# Patient Record
Sex: Female | Born: 1945 | Smoking: Former smoker
Health system: Southern US, Community
[De-identification: ages and names within clinical notes are randomized; demographics above are authoritative.]

## PROBLEM LIST (undated history)

## (undated) DIAGNOSIS — T7840XA Allergy, unspecified, initial encounter: Secondary | ICD-10-CM

## (undated) DIAGNOSIS — J45909 Unspecified asthma, uncomplicated: Secondary | ICD-10-CM

## (undated) DIAGNOSIS — F32A Depression, unspecified: Secondary | ICD-10-CM

## (undated) DIAGNOSIS — E119 Type 2 diabetes mellitus without complications: Secondary | ICD-10-CM

## (undated) DIAGNOSIS — F419 Anxiety disorder, unspecified: Secondary | ICD-10-CM

## (undated) DIAGNOSIS — M1990 Unspecified osteoarthritis, unspecified site: Secondary | ICD-10-CM

## (undated) HISTORY — DX: Unspecified osteoarthritis, unspecified site: M19.90

## (undated) HISTORY — DX: Depression, unspecified: F32.A

## (undated) HISTORY — PX: ABDOMINAL HYSTERECTOMY: SHX81

## (undated) HISTORY — DX: Type 2 diabetes mellitus without complications: E11.9

## (undated) HISTORY — DX: Unspecified asthma, uncomplicated: J45.909

## (undated) HISTORY — DX: Allergy, unspecified, initial encounter: T78.40XA

## (undated) HISTORY — PX: TONSILLECTOMY: SUR1361

## (undated) HISTORY — DX: Anxiety disorder, unspecified: F41.9

---

## 2004-07-27 DIAGNOSIS — J309 Allergic rhinitis, unspecified: Secondary | ICD-10-CM | POA: Insufficient documentation

## 2005-06-16 DIAGNOSIS — K5732 Diverticulitis of large intestine without perforation or abscess without bleeding: Secondary | ICD-10-CM | POA: Insufficient documentation

## 2005-06-21 DIAGNOSIS — B029 Zoster without complications: Secondary | ICD-10-CM | POA: Insufficient documentation

## 2005-07-26 DIAGNOSIS — K8020 Calculus of gallbladder without cholecystitis without obstruction: Secondary | ICD-10-CM | POA: Insufficient documentation

## 2005-12-21 DIAGNOSIS — IMO0002 Reserved for concepts with insufficient information to code with codable children: Secondary | ICD-10-CM | POA: Insufficient documentation

## 2009-05-28 DIAGNOSIS — E559 Vitamin D deficiency, unspecified: Secondary | ICD-10-CM | POA: Insufficient documentation

## 2009-05-28 DIAGNOSIS — E538 Deficiency of other specified B group vitamins: Secondary | ICD-10-CM | POA: Insufficient documentation

## 2009-06-16 DIAGNOSIS — F32A Depression, unspecified: Secondary | ICD-10-CM | POA: Insufficient documentation

## 2010-07-21 DIAGNOSIS — F411 Generalized anxiety disorder: Secondary | ICD-10-CM | POA: Insufficient documentation

## 2011-08-03 DIAGNOSIS — Z90710 Acquired absence of both cervix and uterus: Secondary | ICD-10-CM | POA: Insufficient documentation

## 2011-08-03 DIAGNOSIS — Z9079 Acquired absence of other genital organ(s): Secondary | ICD-10-CM | POA: Insufficient documentation

## 2012-09-05 DIAGNOSIS — R7303 Prediabetes: Secondary | ICD-10-CM | POA: Insufficient documentation

## 2013-07-17 DIAGNOSIS — J4520 Mild intermittent asthma, uncomplicated: Secondary | ICD-10-CM | POA: Insufficient documentation

## 2017-10-10 DIAGNOSIS — M170 Bilateral primary osteoarthritis of knee: Secondary | ICD-10-CM | POA: Insufficient documentation

## 2019-09-06 DIAGNOSIS — K219 Gastro-esophageal reflux disease without esophagitis: Secondary | ICD-10-CM | POA: Insufficient documentation

## 2020-09-24 DIAGNOSIS — E669 Obesity, unspecified: Secondary | ICD-10-CM | POA: Insufficient documentation

## 2020-10-06 DIAGNOSIS — M25512 Pain in left shoulder: Secondary | ICD-10-CM | POA: Insufficient documentation

## 2020-10-23 LAB — COLOGUARD: COLOGUARD: NEGATIVE

## 2020-10-23 LAB — EXTERNAL GENERIC LAB PROCEDURE: COLOGUARD: NEGATIVE

## 2020-12-25 ENCOUNTER — Ambulatory Visit (HOSPITAL_COMMUNITY): Payer: Medicare Other | Attending: Specialist | Admitting: Occupational Therapy

## 2020-12-25 ENCOUNTER — Other Ambulatory Visit: Payer: Self-pay

## 2020-12-25 ENCOUNTER — Encounter (HOSPITAL_COMMUNITY): Payer: Self-pay | Admitting: Occupational Therapy

## 2020-12-25 DIAGNOSIS — M25612 Stiffness of left shoulder, not elsewhere classified: Secondary | ICD-10-CM | POA: Insufficient documentation

## 2020-12-25 DIAGNOSIS — M25512 Pain in left shoulder: Secondary | ICD-10-CM | POA: Insufficient documentation

## 2020-12-25 DIAGNOSIS — R29898 Other symptoms and signs involving the musculoskeletal system: Secondary | ICD-10-CM | POA: Diagnosis present

## 2020-12-25 NOTE — Patient Instructions (Signed)
1) SHOULDER: Flexion On Table   Place hands on towel placed on table, elbows straight. Lean forward with you upper body, pushing towel away from body.  __10_ reps per set, __3_ sets per day  2) Abduction (Passive)   With arm out to side, resting on towel placed on table with palm DOWN, keeping trunk away from table, lean to the side while pushing towel away from body.  Repeat __10__ times. Do __3__ sessions per day.  Copyright  VHI. All rights reserved.     3) Internal Rotation (Assistive)   Seated with elbow bent at right angle and held against side, slide arm on table surface in an inward arc keeping elbow anchored in place. Repeat __10__ times. Do ___3_ sessions per day. Activity: Use this motion to brush crumbs off the table.  Copyright  VHI. All rights reserved.   

## 2020-12-26 ENCOUNTER — Ambulatory Visit (HOSPITAL_COMMUNITY): Payer: Medicare Other | Admitting: Occupational Therapy

## 2020-12-26 NOTE — Therapy (Signed)
[place], [place], [address] Phone: [phone]   Fax:  [phone]  Occupational Therapy Evaluation  Patient Details  Name: Brandy Leon MRN: [id number] Date of Birth: 27-May-1945 Referring Provider (OT): Dr. Sydnee Cabal   Encounter Date: 12/25/2020   OT End of Session - 12/25/20 1419     Visit Number 1    Number of Visits 16    Date for OT Re-Evaluation 02/23/21   Mini-reassessment 01/24/2021   Authorization Type [employer] Medicare, $30 copay    Authorization Time Period visits based on medical necessity    Progress Note Due on Visit 10    OT Start Time 1258    OT Stop Time 1345    OT Time Calculation (min) 47 min    Activity Tolerance Patient tolerated treatment well    Behavior During Therapy [facility] for tasks assessed/performed             History reviewed. No pertinent past medical history.  History reviewed. No pertinent surgical history.  There were no vitals filed for this visit.   Subjective Assessment - 12/25/20 1349     Subjective  S: This is one nothing compared to the right shoulder.    Pertinent History Pt is a 75 y/o female s/p left arthroscopic RCR, SAD, DCR on 12/12/20 presenting in abduction sling. Pt will wear abduction pillow for 2 more weeks then remove, wearing sling for 2 additional weeks after that per MD note. Pt was referred to occupational therapy for evaluation and treatment by Dr. Sydnee Cabal.    Special Tests FOTO: 4/100    Patient Stated Goals To have less pain and more use of the LUE.    Currently in Pain? Yes    Pain Score 4     Pain Location Shoulder    Pain Orientation Left    Pain Descriptors / Indicators Aching;Sore    Pain Type Acute pain    Pain Radiating Towards N/A    Pain Onset 1 to 4 weeks ago    Pain Frequency Intermittent    Aggravating Factors  movement    Pain Relieving Factors rest, ice, pain medication    Effect of Pain on Daily Activities unable to  use LUE for ADLs    Multiple Pain Sites No               [facility] OT Assessment - 12/25/20 1247       Assessment   Medical Diagnosis s/p left arthroscopic RCR, SAD, DCR    Referring Provider (OT) Dr. Sydnee Cabal    Onset Date/Surgical Date 12/12/20    Hand Dominance Right    Next MD Visit 4 weeks    Prior Therapy None      Precautions   Precautions Shoulder    Type of Shoulder Precautions See protocol    Shoulder Interventions Shoulder sling/immobilizer;Off for dressing/bathing/exercises      Restrictions   Weight Bearing Restrictions Yes    LUE Weight Bearing Non weight bearing      Balance Screen   Has the patient fallen in the past 6 months No      Prior Function   Level of Independence Independent    Vocation Retired    Leisure working outside, walking dogs      ADL   ADL comments Pt is unable to use LUE for ADLs and leisure tasks. Sleep is difficult.      Written Expression   Dominant Hand  Right      Cognition   Overall Cognitive Status Within Functional Limits for tasks assessed      Observation/Other Assessments   Focus on Therapeutic Outcomes (FOTO)  4/100      ROM / Strength   AROM / PROM / Strength AROM;PROM;Strength      Palpation   Palpation comment mod fascial restrictions along upper arm, anterior shoulder, trapezius, and scapular regions      AROM   Overall AROM  Deficits;Unable to assess;Due to precautions;Due to pain      PROM   Overall PROM Comments Assessed supine, er/IR adducted    PROM Assessment Site Shoulder    Right/Left Shoulder Left    Left Shoulder Flexion 60 Degrees    Left Shoulder ABduction 50 Degrees    Left Shoulder Internal Rotation 90 Degrees    Left Shoulder External Rotation -9 Degrees      Strength   Overall Strength Deficits;Unable to assess;Due to precautions;Due to pain                              OT Education - 12/25/20 1329     Education Details table slides    Person(s) Educated  Patient    Methods Explanation;Demonstration;Handout    Comprehension Verbalized understanding;Returned demonstration              OT Short Term Goals - 12/26/20 0708       OT SHORT TERM GOAL #1   Title Pt will be provided with and educated on HEP to improve mobility of LUE required for ADL completion.    Time 4    Period Weeks    Status New    Target Date 01/25/21      OT SHORT TERM GOAL #2   Title Pt will increase LUE P/ROM to [facility] to improve ability to complete dressing tasks independently with minimal compensatory techniques.    Time 4    Period Weeks    Status New      OT SHORT TERM GOAL #3   Title Pt will increase LUE strength to 3/5 to improve ability to reach items at waist to chest height during ADLs.    Time 4    Period Weeks    Status New               OT Long Term Goals - 12/26/20 0713       OT LONG TERM GOAL #1   Title Pt will return to highest level of functioning using LUE as non-dominant during functional task completion.    Time 8    Period Weeks    Status New    Target Date 02/24/21      OT LONG TERM GOAL #2   Title Pt will decrease LUE pain to 3/10 or less to improve ability to sleep in the bed for 3+ hours without waking due to pain.    Time 8    Period Weeks    Status New      OT LONG TERM GOAL #3   Title Pt will decrease LUE fascial restrictions to min amounts or less to improve mobility required for overhead reaching tasks.    Time 8    Period Weeks    Status New      OT LONG TERM GOAL #4   Title Pt will increase LUE A/ROM to [facility] to improve ability to reach overhead and behind back during  dressing and bathing tasks.    Time 8    Period Weeks    Status New      OT LONG TERM GOAL #5   Title Pt will increase LUE strength to 4+/5 to improve ability to perform unpacking duties and care for dogs using LUE as non-dominant.    Time 8    Period Weeks    Status New                   Plan - 12/25/20 1420     Clinical  Impression Statement A: Pt is a 75 y/o female s/p left arthroscopic RCR, SAD, DCR on 12/12/20 presenting with pain and limitations impacting functional use of LUE during ADLs and leisure tasks. Pt with abduction sling, can remove pillow in 2 weeks. Pt had right RCR last year and is familiar with the general rehab protocol, requests ice immediately after exercises at therapy as this helped her pain and functioning significantly after prior surgery.    OT Occupational Profile and History Problem Focused Assessment - Including review of records relating to presenting problem    Occupational performance deficits (Please refer to evaluation for details): ADL's;IADL's;Rest and Sleep;Leisure    Body Structure / Function / Physical Skills ADL;Endurance;Muscle spasms;UE functional use;Fascial restriction;Pain;ROM;IADL;Strength;Mobility    Rehab Potential Good    Clinical Decision Making Limited treatment options, no task modification necessary    Comorbidities Affecting Occupational Performance: None    Modification or Assistance to Complete Evaluation  No modification of tasks or assist necessary to complete eval    OT Frequency 2x / week    OT Duration 8 weeks    OT Treatment/Interventions Self-care/ADL training;Ultrasound;Patient/family education;DME and/or AE instruction;Cryotherapy;Electrical Stimulation;Splinting;Moist Heat;Therapeutic exercise;Manual Therapy;Therapeutic activities    Plan P: Pt will benefit from skilled OT services to decrease pain and fascial restrictions, increase joint ROM, strength, and functional use of LUE during ADLs. Treatment plan: myofascial release and manual techniques, P/ROM, AA/ROM, A/ROM, general LUE strengthening, scapular mobility/stability/strengthening, modalities prn.    OT Home Exercise Plan eval: table slides    Consulted and Agree with Plan of Care Patient             Patient will benefit from skilled therapeutic intervention in order to improve the  following deficits and impairments:   Body Structure / Function / Physical Skills: ADL, Endurance, Muscle spasms, UE functional use, Fascial restriction, Pain, ROM, IADL, Strength, Mobility       Visit Diagnosis: Acute pain of left shoulder  Stiffness of left shoulder, not elsewhere classified  Other symptoms and signs involving the musculoskeletal system    Problem List There are no problems to display for this patient.   Guadelupe Sabin, OTR/L  [phone] 12/26/2020, 7:17 AM  [facility] [address] [place], [place], [address] Phone: [phone]   Fax:  [phone]  Name: Fransheska Willingham MRN: [id number] Date of Birth: 09-20-1945

## 2021-01-01 ENCOUNTER — Ambulatory Visit (HOSPITAL_COMMUNITY): Payer: Medicare Other

## 2021-01-01 ENCOUNTER — Other Ambulatory Visit: Payer: Self-pay

## 2021-01-01 ENCOUNTER — Encounter (HOSPITAL_COMMUNITY): Payer: Self-pay

## 2021-01-01 DIAGNOSIS — R29898 Other symptoms and signs involving the musculoskeletal system: Secondary | ICD-10-CM

## 2021-01-01 DIAGNOSIS — M25512 Pain in left shoulder: Secondary | ICD-10-CM

## 2021-01-01 DIAGNOSIS — M25612 Stiffness of left shoulder, not elsewhere classified: Secondary | ICD-10-CM

## 2021-01-01 NOTE — Therapy (Signed)
[place], [place], [address] Phone: [phone]   Fax:  [phone]  Occupational Therapy Treatment  Patient Details  Name: Brandy Leon MRN: [id number] Date of Birth: 1945-05-11 Referring Provider (OT): Dr. Sydnee Cabal   Encounter Date: 01/01/2021   OT End of Session - 01/01/21 1520     Visit Number 2    Number of Visits 16    Date for OT Re-Evaluation 02/23/21   Mini-reassessment 01/24/2021   Authorization Type [employer] Medicare, $30 copay    Authorization Time Period visits based on medical necessity    Progress Note Due on Visit 10    OT Start Time 1430    OT Stop Time 1508    OT Time Calculation (min) 38 min    Activity Tolerance Patient tolerated treatment well    Behavior During Therapy [facility] for tasks assessed/performed             History reviewed. No pertinent past medical history.  History reviewed. No pertinent surgical history.  There were no vitals filed for this visit.   Subjective Assessment - 01/01/21 1433     Currently in Pain? Yes    Pain Score 6     Pain Location Shoulder    Pain Orientation Left    Pain Descriptors / Indicators Constant;Sore    Pain Type Surgical pain    Pain Onset 1 to 4 weeks ago    Pain Frequency Constant    Aggravating Factors  some movement    Pain Relieving Factors pain medication, muscle relaxers, rest, ice    Effect of Pain on Daily Activities Unable to use LUE for ADLs.                [facility] OT Assessment - 01/01/21 1455       Assessment   Medical Diagnosis s/p left arthroscopic RCR, SAD, DCR      Precautions   Precautions Shoulder    Type of Shoulder Precautions See protocol    Shoulder Interventions Shoulder sling/immobilizer;Off for dressing/bathing/exercises                      OT Treatments/Exercises (OP) - 01/01/21 1456       Exercises   Exercises Shoulder      Shoulder Exercises: Supine   Protraction PROM;5  reps    Horizontal ABduction PROM;5 reps;Limitations    Horizontal ABduction Limitations limited range    External Rotation PROM;5 reps    Internal Rotation PROM;5 reps    Flexion PROM;5 reps    ABduction PROM;5 reps;Limitations    ABduction Limitations completed with elbow flexed to 90 degrees or a little less.      Shoulder Exercises: Seated   Extension AROM;10 reps   gentle     Shoulder Exercises: Therapy Ball   Flexion 10 reps   2" hold at end stretch.     Manual Therapy   Manual Therapy Myofascial release    Manual therapy comments Manual therapy completed prior exercises.    Myofascial Release Myofascial release and manual stretching completed to left upper arm, upper trapezius, and scapularis region as well as to anterior portion of the elbow to decrease fascial restrictions and increase joint mobility in a pain free zone.                    OT Education - 01/01/21 1503     Education Details A/ROM elbow, wrist, forearm exercises  Person(s) Educated Patient    Methods Explanation;Demonstration;Handout    Comprehension Verbalized understanding;Returned demonstration              OT Short Term Goals - 01/01/21 1436       OT SHORT TERM GOAL #1   Title Pt will be provided with and educated on HEP to improve mobility of LUE required for ADL completion.    Time 4    Period Weeks    Status On-going    Target Date 01/25/21      OT SHORT TERM GOAL #2   Title Pt will increase LUE P/ROM to [facility] to improve ability to complete dressing tasks independently with minimal compensatory techniques.    Time 4    Period Weeks    Status On-going      OT SHORT TERM GOAL #3   Title Pt will increase LUE strength to 3/5 to improve ability to reach items at waist to chest height during ADLs.    Time 4    Period Weeks    Status On-going               OT Long Term Goals - 01/01/21 1436       OT LONG TERM GOAL #1   Title Pt will return to highest level of  functioning using LUE as non-dominant during functional task completion.    Time 8    Period Weeks    Status On-going      OT LONG TERM GOAL #2   Title Pt will decrease LUE pain to 3/10 or less to improve ability to sleep in the bed for 3+ hours without waking due to pain.    Time 8    Period Weeks    Status On-going      OT LONG TERM GOAL #3   Title Pt will decrease LUE fascial restrictions to min amounts or less to improve mobility required for overhead reaching tasks.    Time 8    Period Weeks    Status On-going      OT LONG TERM GOAL #4   Title Pt will increase LUE A/ROM to [facility] to improve ability to reach overhead and behind back during dressing and bathing tasks.    Time 8    Period Weeks    Status On-going      OT LONG TERM GOAL #5   Title Pt will increase LUE strength to 4+/5 to improve ability to perform unpacking duties and care for dogs using LUE as non-dominant.    Time 8    Period Weeks    Status On-going                   Plan - 01/01/21 1520     Clinical Impression Statement A: Initiated myofascial release to left upper trapezius, upper arm, scapularis region and elbow to address moderate fascial restrictions. Patient presents with pain limitations due to muscle tightness and fascial restrictions. Modifcations to passive ROM movements were provided including abduction with elbow bent. Three towel rolls placed under elbow to help with comfort level. Provided additional exercises to HEP. VC for form and technique were provided during session. Ice pack provided for 10' at end of session.    Body Structure / Function / Physical Skills ADL;Endurance;Muscle spasms;UE functional use;Fascial restriction;Pain;ROM;IADL;Strength;Mobility    Plan P: Continue to follow protocol. Add seated gentle scapular ROW and scapular depression. Continue to work on increasing tolerance to passive ROM as pain level decreases.  OT Home Exercise Plan eval: table slides 11/10: A/ROM  elbow, forearm, wrist    Consulted and Agree with Plan of Care Patient             Patient will benefit from skilled therapeutic intervention in order to improve the following deficits and impairments:   Body Structure / Function / Physical Skills: ADL, Endurance, Muscle spasms, UE functional use, Fascial restriction, Pain, ROM, IADL, Strength, Mobility       Visit Diagnosis: Other symptoms and signs involving the musculoskeletal system  Stiffness of left shoulder, not elsewhere classified  Acute pain of left shoulder    Problem List There are no problems to display for this patient.   Ailene Ravel, OTR/L,CBIS  [phone]  01/01/2021, 3:33 PM  [place] [address] [place], [place], [address] Phone: [phone]   Fax:  [phone]  Name: Enedelia Martorelli MRN: [id number] Date of Birth: 29-Nov-1945

## 2021-01-01 NOTE — Patient Instructions (Signed)
ELBOW FLEXION EXTENSION  Start with your arm at your side. Bend at your elbow to raise your forearm/hand upwards as shown. Then return to starting position and repeat. Complete 10 times. 1 set. At least 3-5 times.       AROM: Wrist Extension   With right palm down, bend wrist up. Repeat 10____ times per set. Do ____ sets per session. Do __3__ sessions per day.  Copyright  VHI. All rights reserved.   AROM: Wrist Flexion   With right palm up, bend wrist up. Repeat ___10_ times per set. Do ____ sets per session. Do __3__ sessions per day.  Copyright  VHI. All rights reserved.   AROM: Forearm Pronation / Supination   With right arm in handshake position, slowly rotate palm down until stretch is felt. Relax. Then rotate palm up until stretch is felt. Repeat __10__ times per set. Do ____ sets per session. Do __3__ sessions per day.  Copyright  VHI. All rights reserved.   AFlexion (Passive)

## 2021-01-05 ENCOUNTER — Encounter (HOSPITAL_COMMUNITY): Payer: Medicare Other

## 2021-01-06 ENCOUNTER — Ambulatory Visit (HOSPITAL_COMMUNITY): Payer: Medicare Other | Admitting: Occupational Therapy

## 2021-01-06 ENCOUNTER — Other Ambulatory Visit: Payer: Self-pay

## 2021-01-06 ENCOUNTER — Encounter (HOSPITAL_COMMUNITY): Payer: Self-pay | Admitting: Occupational Therapy

## 2021-01-06 DIAGNOSIS — M25512 Pain in left shoulder: Secondary | ICD-10-CM | POA: Diagnosis not present

## 2021-01-06 DIAGNOSIS — M25612 Stiffness of left shoulder, not elsewhere classified: Secondary | ICD-10-CM

## 2021-01-06 DIAGNOSIS — R29898 Other symptoms and signs involving the musculoskeletal system: Secondary | ICD-10-CM

## 2021-01-06 NOTE — Therapy (Signed)
[place], [place], [address] Phone: [phone]   Fax:  [phone]  Occupational Therapy Treatment  Patient Details  Name: Brandy Leon MRN: [id number] Date of Birth: 03-Feb-1946 Referring Provider (OT): Dr. Sydnee Cabal   Encounter Date: 01/06/2021   OT End of Session - 01/06/21 1155     Visit Number 3    Number of Visits 16    Date for OT Re-Evaluation 02/23/21   Mini-reassessment 01/24/2021   Authorization Type [employer] Medicare, $30 copay    Authorization Time Period visits based on medical necessity    Progress Note Due on Visit 10    OT Start Time 1117    OT Stop Time 1157    OT Time Calculation (min) 40 min    Activity Tolerance Patient tolerated treatment well    Behavior During Therapy [facility] for tasks assessed/performed             History reviewed. No pertinent past medical history.  History reviewed. No pertinent surgical history.  There were no vitals filed for this visit.   Subjective Assessment - 01/06/21 1117     Subjective  S: It's been hurting some.    Currently in Pain? Yes    Pain Score 3     Pain Location Shoulder    Pain Orientation Left    Pain Descriptors / Indicators Aching;Sore    Pain Type Acute pain    Pain Radiating Towards N/A    Pain Onset 1 to 4 weeks ago    Pain Frequency Constant    Aggravating Factors  some movement    Pain Relieving Factors pain medication, muscle relaxers, rest, ice    Effect of Pain on Daily Activities unable to use LUE for ADLs    Multiple Pain Sites No                [facility] OT Assessment - 01/06/21 1116       Assessment   Medical Diagnosis s/p left arthroscopic RCR, SAD, DCR      Precautions   Precautions Shoulder    Type of Shoulder Precautions See protocol    Shoulder Interventions Shoulder sling/immobilizer;Off for dressing/bathing/exercises                      OT Treatments/Exercises (OP) - 01/06/21 1119        Exercises   Exercises Shoulder      Shoulder Exercises: Supine   Protraction PROM;5 reps    Horizontal ABduction PROM;5 reps;Limitations    Horizontal ABduction Limitations limited range    External Rotation PROM;5 reps    Internal Rotation PROM;5 reps    Flexion PROM;5 reps    ABduction PROM;5 reps;Limitations    ABduction Limitations completed with elbow flexed to 90 degrees or a little less.      Shoulder Exercises: Seated   Extension AROM;10 reps    Row AROM;10 reps      Shoulder Exercises: Therapy Ball   Flexion 10 reps   2" hold at end range     Manual Therapy   Manual Therapy Myofascial release    Manual therapy comments Manual therapy completed prior exercises.    Myofascial Release Myofascial release and manual stretching completed to left upper arm, upper trapezius, and scapularis region as well as to anterior portion of the elbow to decrease fascial restrictions and increase joint mobility in a pain free zone.  OT Short Term Goals - 01/01/21 1436       OT SHORT TERM GOAL #1   Title Pt will be provided with and educated on HEP to improve mobility of LUE required for ADL completion.    Time 4    Period Weeks    Status On-going    Target Date 01/25/21      OT SHORT TERM GOAL #2   Title Pt will increase LUE P/ROM to [facility] to improve ability to complete dressing tasks independently with minimal compensatory techniques.    Time 4    Period Weeks    Status On-going      OT SHORT TERM GOAL #3   Title Pt will increase LUE strength to 3/5 to improve ability to reach items at waist to chest height during ADLs.    Time 4    Period Weeks    Status On-going               OT Long Term Goals - 01/01/21 1436       OT LONG TERM GOAL #1   Title Pt will return to highest level of functioning using LUE as non-dominant during functional task completion.    Time 8    Period Weeks    Status On-going      OT LONG TERM GOAL #2    Title Pt will decrease LUE pain to 3/10 or less to improve ability to sleep in the bed for 3+ hours without waking due to pain.    Time 8    Period Weeks    Status On-going      OT LONG TERM GOAL #3   Title Pt will decrease LUE fascial restrictions to min amounts or less to improve mobility required for overhead reaching tasks.    Time 8    Period Weeks    Status On-going      OT LONG TERM GOAL #4   Title Pt will increase LUE A/ROM to [facility] to improve ability to reach overhead and behind back during dressing and bathing tasks.    Time 8    Period Weeks    Status On-going      OT LONG TERM GOAL #5   Title Pt will increase LUE strength to 4+/5 to improve ability to perform unpacking duties and care for dogs using LUE as non-dominant.    Time 8    Period Weeks    Status On-going                   Plan - 01/06/21 1143     Clinical Impression Statement A: Continued with myofascial release to address fascial restrictions, small muscle knot noted at inferior scapula today. Continued with passive stretching, pt able to achieve 90 degrees flexion, however abduction and er are limited due to pain and catching. Added seated row and scapular depression, continued with extension. Verbal cuing for form and technique.    Body Structure / Function / Physical Skills ADL;Endurance;Muscle spasms;UE functional use;Fascial restriction;Pain;ROM;IADL;Strength;Mobility    Plan P: Continue to follow protocol. Continue to work on increasing tolerance to passive ROM as pain level decreases.    OT Home Exercise Plan eval: table slides 11/10: A/ROM elbow, forearm, wrist    Consulted and Agree with Plan of Care Patient             Patient will benefit from skilled therapeutic intervention in order to improve the following deficits and impairments:   Body Structure /  Function / Physical Skills: ADL, Endurance, Muscle spasms, UE functional use, Fascial restriction, Pain, ROM, IADL, Strength,  Mobility       Visit Diagnosis: Other symptoms and signs involving the musculoskeletal system  Stiffness of left shoulder, not elsewhere classified  Acute pain of left shoulder    Problem List There are no problems to display for this patient.   Guadelupe Sabin, OTR/L  [phone] 01/06/2021, 12:55 PM  [place] [address] [place], [place], [address] Phone: [phone]   Fax:  [phone]  Name: Brandy Leon MRN: [id number] Date of Birth: 1945/11/25

## 2021-01-08 ENCOUNTER — Ambulatory Visit (HOSPITAL_COMMUNITY): Payer: Medicare Other | Admitting: Occupational Therapy

## 2021-01-08 ENCOUNTER — Other Ambulatory Visit: Payer: Self-pay

## 2021-01-08 ENCOUNTER — Encounter (HOSPITAL_COMMUNITY): Payer: Self-pay | Admitting: Occupational Therapy

## 2021-01-08 DIAGNOSIS — M25512 Pain in left shoulder: Secondary | ICD-10-CM | POA: Diagnosis not present

## 2021-01-08 DIAGNOSIS — R29898 Other symptoms and signs involving the musculoskeletal system: Secondary | ICD-10-CM

## 2021-01-08 DIAGNOSIS — M25612 Stiffness of left shoulder, not elsewhere classified: Secondary | ICD-10-CM

## 2021-01-08 NOTE — Therapy (Signed)
[place], [place], [address] Phone: [phone]   Fax:  [phone]  Occupational Therapy Treatment  Patient Details  Name: Brandy Leon MRN: [id number] Date of Birth: September 24, 1945 Referring Provider (OT): Dr. Sydnee Cabal   Encounter Date: 01/08/2021   OT End of Session - 01/08/21 1135     Visit Number 4    Number of Visits 16    Date for OT Re-Evaluation 02/23/21   Mini-reassessment 01/24/2021   Authorization Type [employer] Medicare, $30 copay    Authorization Time Period visits based on medical necessity    Progress Note Due on Visit 10    OT Start Time [phone]    OT Stop Time 1026    OT Time Calculation (min) 39 min    Activity Tolerance Patient tolerated treatment well    Behavior During Therapy [facility] for tasks assessed/performed             History reviewed. No pertinent past medical history.  History reviewed. No pertinent surgical history.  There were no vitals filed for this visit.   Subjective Assessment - 01/08/21 0949     Subjective  S: They're going. (exercises)    Currently in Pain? No/denies                [facility] OT Assessment - 01/08/21 0948       Assessment   Medical Diagnosis s/p left arthroscopic RCR, SAD, DCR      Precautions   Precautions Shoulder    Type of Shoulder Precautions See protocol    Shoulder Interventions Shoulder sling/immobilizer;Off for dressing/bathing/exercises                      OT Treatments/Exercises (OP) - 01/08/21 0949       Exercises   Exercises Shoulder      Shoulder Exercises: Supine   Protraction PROM;5 reps    Horizontal ABduction PROM;5 reps;Limitations    Horizontal ABduction Limitations limited range    External Rotation PROM;5 reps;AAROM;10 reps    Internal Rotation PROM;5 reps;AAROM;10 reps    Flexion PROM;5 reps    ABduction PROM;5 reps;Limitations    ABduction Limitations limited range      Shoulder Exercises:  Seated   Extension AROM;10 reps    Row AROM;10 reps      Shoulder Exercises: Therapy Ball   Flexion 10 reps   2" hold at end range     Shoulder Exercises: Isometric Strengthening   Flexion Supine;3X5"    Extension Supine;3X5"    External Rotation Supine;3X5"    Internal Rotation Supine;3X5"    ABduction Supine;3X5"    ADduction Supine;3X5"      Manual Therapy   Manual Therapy Myofascial release    Manual therapy comments Manual therapy completed prior exercises.    Myofascial Release Myofascial release and manual stretching completed to left upper arm, upper trapezius, and scapularis region as well as to anterior portion of the elbow to decrease fascial restrictions and increase joint mobility in a pain free zone.                      OT Short Term Goals - 01/01/21 1436       OT SHORT TERM GOAL #1   Title Pt will be provided with and educated on HEP to improve mobility of LUE required for ADL completion.    Time 4    Period Weeks    Status  On-going    Target Date 01/25/21      OT SHORT TERM GOAL #2   Title Pt will increase LUE P/ROM to [facility] to improve ability to complete dressing tasks independently with minimal compensatory techniques.    Time 4    Period Weeks    Status On-going      OT SHORT TERM GOAL #3   Title Pt will increase LUE strength to 3/5 to improve ability to reach items at waist to chest height during ADLs.    Time 4    Period Weeks    Status On-going               OT Long Term Goals - 01/01/21 1436       OT LONG TERM GOAL #1   Title Pt will return to highest level of functioning using LUE as non-dominant during functional task completion.    Time 8    Period Weeks    Status On-going      OT LONG TERM GOAL #2   Title Pt will decrease LUE pain to 3/10 or less to improve ability to sleep in the bed for 3+ hours without waking due to pain.    Time 8    Period Weeks    Status On-going      OT LONG TERM GOAL #3   Title Pt will  decrease LUE fascial restrictions to min amounts or less to improve mobility required for overhead reaching tasks.    Time 8    Period Weeks    Status On-going      OT LONG TERM GOAL #4   Title Pt will increase LUE A/ROM to [facility] to improve ability to reach overhead and behind back during dressing and bathing tasks.    Time 8    Period Weeks    Status On-going      OT LONG TERM GOAL #5   Title Pt will increase LUE strength to 4+/5 to improve ability to perform unpacking duties and care for dogs using LUE as non-dominant.    Time 8    Period Weeks    Status On-going                   Plan - 01/08/21 1019     Clinical Impression Statement A: Continued with myofascial release to address fascial restrictions and muscle knots. Added supine er/IR AA/ROM and isometrics per protocol, continued with seated A/ROM. Pt tolerating approximately 100-110 degrees flexion today, abduction continues to be limited to less than 50% due to pain and catching. Verbal cuing for form and technique.    Body Structure / Function / Physical Skills ADL;Endurance;Muscle spasms;UE functional use;Fascial restriction;Pain;ROM;IADL;Strength;Mobility    Plan P: Progress to phase II of protocol adding AA/ROM as pt is able to tolerate and complete with good form    OT Home Exercise Plan eval: table slides 11/10: A/ROM elbow, forearm, wrist    Consulted and Agree with Plan of Care Patient             Patient will benefit from skilled therapeutic intervention in order to improve the following deficits and impairments:   Body Structure / Function / Physical Skills: ADL, Endurance, Muscle spasms, UE functional use, Fascial restriction, Pain, ROM, IADL, Strength, Mobility       Visit Diagnosis: Other symptoms and signs involving the musculoskeletal system  Stiffness of left shoulder, not elsewhere classified  Acute pain of left shoulder    Problem List There  are no problems to display for this  patient.   Guadelupe Sabin, OTR/L  [phone] 01/08/2021, 11:36 AM  [place] [address] [place], [place], [address] Phone: [phone]   Fax:  [phone]  Name: Brandy Leon MRN: [id number] Date of Birth: 1945-10-11

## 2021-01-12 ENCOUNTER — Ambulatory Visit (HOSPITAL_COMMUNITY): Payer: Medicare Other | Admitting: Occupational Therapy

## 2021-01-12 ENCOUNTER — Encounter (HOSPITAL_COMMUNITY): Payer: Self-pay | Admitting: Occupational Therapy

## 2021-01-12 ENCOUNTER — Other Ambulatory Visit: Payer: Self-pay

## 2021-01-12 DIAGNOSIS — M25612 Stiffness of left shoulder, not elsewhere classified: Secondary | ICD-10-CM

## 2021-01-12 DIAGNOSIS — M25512 Pain in left shoulder: Secondary | ICD-10-CM

## 2021-01-12 DIAGNOSIS — R29898 Other symptoms and signs involving the musculoskeletal system: Secondary | ICD-10-CM

## 2021-01-12 NOTE — Therapy (Signed)
[place], [place], [address] Phone: [phone]   Fax:  [phone]  Occupational Therapy Treatment  Patient Details  Name: Brandy Leon MRN: [id number] Date of Birth: [birth year] Referring Provider (OT): Dr. Sydnee Cabal   Encounter Date: 01/12/2021   OT End of Session - 01/12/21 1116     Visit Number 5    Number of Visits 16    Date for OT Re-Evaluation 02/23/21   Mini-reassessment 01/24/2021   Authorization Type [employer] Medicare, $30 copay    Authorization Time Period visits based on medical necessity    Progress Note Due on Visit 10    OT Start Time 1031    OT Stop Time 1112    OT Time Calculation (min) 41 min    Activity Tolerance Patient tolerated treatment well    Behavior During Therapy [facility] for tasks assessed/performed             History reviewed. No pertinent past medical history.  History reviewed. No pertinent surgical history.  There were no vitals filed for this visit.   Subjective Assessment - 01/12/21 1030     Subjective  S: I've been moving my arm a little bit during the day.    Currently in Pain? No/denies                [facility] OT Assessment - 01/12/21 1030       Assessment   Medical Diagnosis s/p left arthroscopic RCR, SAD, DCR      Precautions   Precautions Shoulder    Type of Shoulder Precautions See protocol                      OT Treatments/Exercises (OP) - 01/12/21 1033       Exercises   Exercises Shoulder      Shoulder Exercises: Supine   Protraction PROM;5 reps;AAROM;10 reps    Horizontal ABduction PROM;5 reps;Limitations    Horizontal ABduction Limitations limited range    External Rotation PROM;5 reps;AAROM;10 reps    Internal Rotation PROM;5 reps;AAROM;10 reps    Flexion PROM;5 reps;AAROM;10 reps    ABduction PROM;5 reps;Limitations    ABduction Limitations limited range      Shoulder Exercises: Seated   Extension AROM;10 reps     Row AROM;10 reps    External Rotation AAROM;10 reps    Internal Rotation AAROM;10 reps      Shoulder Exercises: Pulleys   Flexion 1 minute   seated   ABduction 1 minute   seated     Shoulder Exercises: ROM/Strengthening   Thumb Tacks 1' low level    Prot/Ret//Elev/Dep 1' low level    Other ROM/Strengthening Exercises Using kickball: ABCs on tabletop      Manual Therapy   Manual Therapy Myofascial release    Manual therapy comments Manual therapy completed prior exercises.    Myofascial Release Myofascial release and manual stretching completed to left upper arm, upper trapezius, and scapularis region as well as to anterior portion of the elbow to decrease fascial restrictions and increase joint mobility in a pain free zone.                      OT Short Term Goals - 01/01/21 1436       OT SHORT TERM GOAL #1   Title Pt will be provided with and educated on HEP to improve mobility of LUE required for ADL completion.  Time 4    Period Weeks    Status On-going    Target Date 01/25/21      OT SHORT TERM GOAL #2   Title Pt will increase LUE P/ROM to [facility] to improve ability to complete dressing tasks independently with minimal compensatory techniques.    Time 4    Period Weeks    Status On-going      OT SHORT TERM GOAL #3   Title Pt will increase LUE strength to 3/5 to improve ability to reach items at waist to chest height during ADLs.    Time 4    Period Weeks    Status On-going               OT Long Term Goals - 01/01/21 1436       OT LONG TERM GOAL #1   Title Pt will return to highest level of functioning using LUE as non-dominant during functional task completion.    Time 8    Period Weeks    Status On-going      OT LONG TERM GOAL #2   Title Pt will decrease LUE pain to 3/10 or less to improve ability to sleep in the bed for 3+ hours without waking due to pain.    Time 8    Period Weeks    Status On-going      OT LONG TERM GOAL #3   Title Pt  will decrease LUE fascial restrictions to min amounts or less to improve mobility required for overhead reaching tasks.    Time 8    Period Weeks    Status On-going      OT LONG TERM GOAL #4   Title Pt will increase LUE A/ROM to [facility] to improve ability to reach overhead and behind back during dressing and bathing tasks.    Time 8    Period Weeks    Status On-going      OT LONG TERM GOAL #5   Title Pt will increase LUE strength to 4+/5 to improve ability to perform unpacking duties and care for dogs using LUE as non-dominant.    Time 8    Period Weeks    Status On-going                   Plan - 01/12/21 1129     Clinical Impression Statement A: Continued with myofascial release to address fascial restrictions, passive stretching completed. Progressed to Phase II of protocol, adding AA/ROM protraction and flexion in supine, pulleys, ball ABCs, thumb tacks, and prot/ret/elev/dep. Pt continues to be limited in abduction due to pain at approximately 70 degrees. Verbal cuing for form and technique.    Body Structure / Function / Physical Skills ADL;Endurance;Muscle spasms;UE functional use;Fascial restriction;Pain;ROM;IADL;Strength;Mobility    Plan P: Continue with AA/ROM in supine, adding horizontal abduction. Continue working towards improved ROM    OT Home Exercise Plan eval: table slides 11/10: A/ROM elbow, forearm, wrist    Consulted and Agree with Plan of Care Patient             Patient will benefit from skilled therapeutic intervention in order to improve the following deficits and impairments:   Body Structure / Function / Physical Skills: ADL, Endurance, Muscle spasms, UE functional use, Fascial restriction, Pain, ROM, IADL, Strength, Mobility       Visit Diagnosis: Other symptoms and signs involving the musculoskeletal system  Stiffness of left shoulder, not elsewhere classified  Acute pain of left shoulder  Problem List There are no problems to display  for this patient.   Guadelupe Sabin, OTR/L  [phone] 01/12/2021, 11:32 AM  [place] [address] [place], [place], [address] Phone: [phone]   Fax:  [phone]  Name: Brandy Leon MRN: [id number] Date of Birth: April 17, 1945

## 2021-01-14 ENCOUNTER — Encounter (HOSPITAL_COMMUNITY): Payer: Medicare Other | Admitting: Occupational Therapy

## 2021-01-20 ENCOUNTER — Ambulatory Visit (HOSPITAL_COMMUNITY): Payer: Medicare Other

## 2021-01-20 ENCOUNTER — Other Ambulatory Visit: Payer: Self-pay

## 2021-01-20 ENCOUNTER — Encounter (HOSPITAL_COMMUNITY): Payer: Self-pay

## 2021-01-20 DIAGNOSIS — R29898 Other symptoms and signs involving the musculoskeletal system: Secondary | ICD-10-CM

## 2021-01-20 DIAGNOSIS — M25512 Pain in left shoulder: Secondary | ICD-10-CM

## 2021-01-20 DIAGNOSIS — M25612 Stiffness of left shoulder, not elsewhere classified: Secondary | ICD-10-CM

## 2021-01-20 NOTE — Patient Instructions (Signed)

## 2021-01-20 NOTE — Therapy (Signed)
[place], [place], [address] Phone: [phone]   Fax:  [phone]  Occupational Therapy Treatment  Patient Details  Name: Brandy Leon MRN: [id number] Date of Birth: [birth year] Referring Provider (OT): Dr. Sydnee Cabal  Progress Note Reporting Period 12/25/20 to 01/20/21  See note below for Objective Data and Assessment of Progress/Goals.      Encounter Date: 01/20/2021   OT End of Session - 01/20/21 1447     Visit Number 6    Number of Visits 16    Date for OT Re-Evaluation 02/23/21    Authorization Type [employer] Medicare, $30 copay    Authorization Time Period visits based on medical necessity    Progress Note Due on Visit 16    OT Start Time 1345    OT Stop Time 1430    OT Time Calculation (min) 45 min    Activity Tolerance Patient tolerated treatment well    Behavior During Therapy [facility] for tasks assessed/performed             History reviewed. No pertinent past medical history.  History reviewed. No pertinent surgical history.  There were no vitals filed for this visit.   Subjective Assessment - 01/20/21 1350     Subjective  S: I started going without the sling.    Currently in Pain? Yes    Pain Score 4     Pain Location Arm   shoulder, elbow, and wrist   Pain Orientation Left    Pain Descriptors / Indicators Aching;Sore    Pain Type Acute pain    Pain Onset 1 to 4 weeks ago    Pain Frequency Intermittent    Aggravating Factors  certain movements    Pain Relieving Factors rest, pain medicaiton, muscle relaxers, ice    Effect of Pain on Daily Activities max effect                [facility] OT Assessment - 01/20/21 1358       Assessment   Medical Diagnosis s/p left arthroscopic RCR, SAD, DCR      Precautions   Precautions Shoulder    Type of Shoulder Precautions See protocol      Observation/Other Assessments   Focus on Therapeutic Outcomes (FOTO)  50/100      ROM /  Strength   AROM / PROM / Strength AROM;PROM;Strength      Palpation   Palpation comment mod fascial restrictions along upper arm, anterior shoulder, trapezius, and scapular regions      AROM   Overall AROM Comments Assessed supine. IR/ er adducted. A/ROM not assessed prior to this date.    AROM Assessment Site Shoulder    Right/Left Shoulder Left    Left Shoulder Flexion 132 Degrees    Left Shoulder ABduction 90 Degrees    Left Shoulder Internal Rotation 90 Degrees    Left Shoulder External Rotation 20 Degrees      PROM   Overall PROM Comments Assessed supine, er/IR adducted    PROM Assessment Site Shoulder    Right/Left Shoulder Left    Left Shoulder Flexion 120 Degrees   previous: 60   Left Shoulder ABduction 110 Degrees   previous: 50   Left Shoulder Internal Rotation 90 Degrees   previous: same   Left Shoulder External Rotation 15 Degrees   previous: -9                     OT Treatments/Exercises (  OP) - 01/20/21 1407       Exercises   Exercises Shoulder      Shoulder Exercises: Supine   Protraction PROM;5 reps;AAROM;10 reps    Horizontal ABduction PROM;5 reps;AAROM;10 reps    External Rotation PROM;5 reps;AAROM;10 reps    Internal Rotation PROM;5 reps;AAROM;10 reps    Flexion PROM;5 reps;AAROM;10 reps    ABduction PROM;5 reps;Limitations    ABduction Limitations P/ROM to shoulder level      Shoulder Exercises: Seated   Protraction AAROM;10 reps    External Rotation AAROM;10 reps    Internal Rotation AAROM;10 reps    Flexion AAROM;10 reps    Abduction AAROM;10 reps      Manual Therapy   Manual Therapy Myofascial release    Manual therapy comments Manual therapy completed prior exercises.    Myofascial Release Myofascial release and manual stretching completed to left upper arm, upper trapezius, and scapularis region as well as to anterior portion of the elbow to decrease fascial restrictions and increase joint mobility in a pain free zone.                     OT Education - 01/20/21 1416     Education Details AA/ROM shoulder exercises    Person(s) Educated Patient    Methods Explanation;Demonstration;Handout    Comprehension Verbalized understanding;Returned demonstration              OT Short Term Goals - 01/20/21 1448       OT SHORT TERM GOAL #1   Title Pt will be provided with and educated on HEP to improve mobility of LUE required for ADL completion.    Time 4    Period Weeks    Status Achieved    Target Date 01/25/21      OT SHORT TERM GOAL #2   Title Pt will increase LUE P/ROM to [facility] to improve ability to complete dressing tasks independently with minimal compensatory techniques.    Time 4    Period Weeks    Status Achieved      OT SHORT TERM GOAL #3   Title Pt will increase LUE strength to 3/5 to improve ability to reach items at waist to chest height during ADLs.    Time 4    Period Weeks    Status Achieved               OT Long Term Goals - 01/01/21 1436       OT LONG TERM GOAL #1   Title Pt will return to highest level of functioning using LUE as non-dominant during functional task completion.    Time 8    Period Weeks    Status On-going      OT LONG TERM GOAL #2   Title Pt will decrease LUE pain to 3/10 or less to improve ability to sleep in the bed for 3+ hours without waking due to pain.    Time 8    Period Weeks    Status On-going      OT LONG TERM GOAL #3   Title Pt will decrease LUE fascial restrictions to min amounts or less to improve mobility required for overhead reaching tasks.    Time 8    Period Weeks    Status On-going      OT LONG TERM GOAL #4   Title Pt will increase LUE A/ROM to [facility] to improve ability to reach overhead and behind back during dressing and bathing tasks.  Time 8    Period Weeks    Status On-going      OT LONG TERM GOAL #5   Title Pt will increase LUE strength to 4+/5 to improve ability to perform unpacking duties and care for dogs  using LUE as non-dominant.    Time 8    Period Weeks    Status On-going                   Plan - 01/20/21 1448     Clinical Impression Statement A: Mini reassessment completed this date. Pt report that her follow up visit with Dr. Theda Sers tomorrow. Patient has made improvement with P/ROM even though she continues to muscle guard during passive ROM. A/ROM supine measurements were taken as well. At this time, patient has met her short term goals and is progressing towards her longer term goals. She reports donning and doffing her shirts is becoming easier. Reports continued difficulty with washing her hair due to ROM limitations. Completed supine and seated AA/ROM with HEP updated. VC for form and technique provided with modifications provided if needed due to pain.    Body Structure / Function / Physical Skills ADL;Endurance;Muscle spasms;UE functional use;Fascial restriction;Pain;ROM;IADL;Strength;Mobility    Plan P: Add pulleys and PVC pipe slide. Follow up on MD appointment    OT Home Exercise Plan eval: table slides 11/10: A/ROM elbow, forearm, wrist 11/29: AA/ROM    Consulted and Agree with Plan of Care Patient             Patient will benefit from skilled therapeutic intervention in order to improve the following deficits and impairments:   Body Structure / Function / Physical Skills: ADL, Endurance, Muscle spasms, UE functional use, Fascial restriction, Pain, ROM, IADL, Strength, Mobility       Visit Diagnosis: Stiffness of left shoulder, not elsewhere classified  Acute pain of left shoulder  Other symptoms and signs involving the musculoskeletal system    Problem List There are no problems to display for this patient.   Ailene Ravel, OTR/L,CBIS  [phone]  01/20/2021, 3:13 PM  [place] [address] [place], [place], [address] Phone: [phone]   Fax:  [phone]  Name: Brandy Leon MRN:  [id number] Date of Birth: Sep 03, 1945

## 2021-01-22 ENCOUNTER — Ambulatory Visit (HOSPITAL_COMMUNITY): Payer: Medicare Other | Attending: Specialist | Admitting: Occupational Therapy

## 2021-01-22 ENCOUNTER — Other Ambulatory Visit: Payer: Self-pay

## 2021-01-22 ENCOUNTER — Encounter (HOSPITAL_COMMUNITY): Payer: Self-pay | Admitting: Occupational Therapy

## 2021-01-22 DIAGNOSIS — M25512 Pain in left shoulder: Secondary | ICD-10-CM | POA: Diagnosis present

## 2021-01-22 DIAGNOSIS — R29898 Other symptoms and signs involving the musculoskeletal system: Secondary | ICD-10-CM | POA: Insufficient documentation

## 2021-01-22 DIAGNOSIS — M25612 Stiffness of left shoulder, not elsewhere classified: Secondary | ICD-10-CM | POA: Insufficient documentation

## 2021-01-22 NOTE — Therapy (Signed)
[place], [place], [address] Phone: [phone]   Fax:  [phone]  Occupational Therapy Treatment  Patient Details  Name: Brandy Leon MRN: [id number] Date of Birth: August 12, 1945 Referring Provider (OT): Dr. Sydnee Cabal   Encounter Date: 01/22/2021   OT End of Session - 01/22/21 1427     Visit Number 7    Number of Visits 16    Date for OT Re-Evaluation 02/23/21    Authorization Type [employer] Medicare, $30 copay    Authorization Time Period visits based on medical necessity    Progress Note Due on Visit 16    OT Start Time 1345    OT Stop Time 1426    OT Time Calculation (min) 41 min    Activity Tolerance Patient tolerated treatment well    Behavior During Therapy [facility] for tasks assessed/performed             History reviewed. No pertinent past medical history.  History reviewed. No pertinent surgical history.  There were no vitals filed for this visit.   Subjective Assessment - 01/22/21 1342     Subjective  S: They said it looks good.    Currently in Pain? Yes    Pain Score 1     Pain Location Shoulder    Pain Orientation Left    Pain Descriptors / Indicators Aching;Sore    Pain Type Acute pain    Pain Radiating Towards N/A    Pain Onset 1 to 4 weeks ago    Pain Frequency Intermittent    Aggravating Factors  certain movements    Pain Relieving Factors rest, pain medication, muscle relaxers, ice    Effect of Pain on Daily Activities mod effect    Multiple Pain Sites No                [facility] OT Assessment - 01/22/21 1342       Assessment   Medical Diagnosis s/p left arthroscopic RCR, SAD, DCR      Precautions   Precautions Shoulder    Type of Shoulder Precautions See protocol                      OT Treatments/Exercises (OP) - 01/22/21 1347       Exercises   Exercises Shoulder      Shoulder Exercises: Supine   Protraction PROM;5 reps;AAROM;10 reps     Horizontal ABduction PROM;5 reps;AAROM;10 reps    External Rotation PROM;5 reps;AAROM;10 reps    Internal Rotation PROM;5 reps;AAROM;10 reps    Flexion PROM;5 reps;AAROM;10 reps    ABduction PROM;5 reps;Limitations    ABduction Limitations P/ROM to shoulder level      Shoulder Exercises: Seated   Protraction AAROM;10 reps    External Rotation AAROM;10 reps    Internal Rotation AAROM;10 reps    Flexion AAROM;10 reps    Abduction AAROM;10 reps      Shoulder Exercises: Pulleys   Flexion 1 minute    ABduction 1 minute      Shoulder Exercises: ROM/Strengthening   Thumb Tacks 1' low level    Proximal Shoulder Strengthening, Supine 10X each, no rest breaks    Other ROM/Strengthening Exercises PVC pipe slide:                      OT Short Term Goals - 01/20/21 1448       OT SHORT TERM GOAL #1   Title Pt  will be provided with and educated on HEP to improve mobility of LUE required for ADL completion.    Time 4    Period Weeks    Status Achieved    Target Date 01/25/21      OT SHORT TERM GOAL #2   Title Pt will increase LUE P/ROM to [facility] to improve ability to complete dressing tasks independently with minimal compensatory techniques.    Time 4    Period Weeks    Status Achieved      OT SHORT TERM GOAL #3   Title Pt will increase LUE strength to 3/5 to improve ability to reach items at waist to chest height during ADLs.    Time 4    Period Weeks    Status Achieved               OT Long Term Goals - 01/01/21 1436       OT LONG TERM GOAL #1   Title Pt will return to highest level of functioning using LUE as non-dominant during functional task completion.    Time 8    Period Weeks    Status On-going      OT LONG TERM GOAL #2   Title Pt will decrease LUE pain to 3/10 or less to improve ability to sleep in the bed for 3+ hours without waking due to pain.    Time 8    Period Weeks    Status On-going      OT LONG TERM GOAL #3   Title Pt will decrease LUE  fascial restrictions to min amounts or less to improve mobility required for overhead reaching tasks.    Time 8    Period Weeks    Status On-going      OT LONG TERM GOAL #4   Title Pt will increase LUE A/ROM to [facility] to improve ability to reach overhead and behind back during dressing and bathing tasks.    Time 8    Period Weeks    Status On-going      OT LONG TERM GOAL #5   Title Pt will increase LUE strength to 4+/5 to improve ability to perform unpacking duties and care for dogs using LUE as non-dominant.    Time 8    Period Weeks    Status On-going                   Plan - 01/22/21 1411     Clinical Impression Statement A: Continued with myofascial release to address fascial restrictions, passive stretching. Pt completing AA/ROM in supine and sitting today, added proximal shoulder strengthening, PVC pipe slide, and pulleys today. Verbal cuing for form and technique. Pt with occasional flinching during AA/ROM, reports she can feel a pull in the shoulder.    Body Structure / Function / Physical Skills ADL;Endurance;Muscle spasms;UE functional use;Fascial restriction;Pain;ROM;IADL;Strength;Mobility    Plan P: Pt is now able to progressed as tolerated to phase III A/ROM. continue with AA/ROM working to increase ROM.    OT Home Exercise Plan eval: table slides 11/10: A/ROM elbow, forearm, wrist 11/29: AA/ROM    Consulted and Agree with Plan of Care Patient             Patient will benefit from skilled therapeutic intervention in order to improve the following deficits and impairments:   Body Structure / Function / Physical Skills: ADL, Endurance, Muscle spasms, UE functional use, Fascial restriction, Pain, ROM, IADL, Strength, Mobility  Visit Diagnosis: Stiffness of left shoulder, not elsewhere classified  Acute pain of left shoulder  Other symptoms and signs involving the musculoskeletal system    Problem List There are no problems to display for this  patient.   Guadelupe Sabin, OTR/L  [phone] 01/22/2021, 2:27 PM  [facility] [address] [place], [place], [address] Phone: [phone]   Fax:  [phone]  Name: Shenea Giacobbe MRN: [id number] Date of Birth: [birth year]

## 2021-01-27 ENCOUNTER — Encounter (HOSPITAL_COMMUNITY): Payer: Self-pay

## 2021-01-27 ENCOUNTER — Other Ambulatory Visit: Payer: Self-pay

## 2021-01-27 ENCOUNTER — Ambulatory Visit (HOSPITAL_COMMUNITY): Payer: Medicare Other

## 2021-01-27 DIAGNOSIS — M25612 Stiffness of left shoulder, not elsewhere classified: Secondary | ICD-10-CM

## 2021-01-27 DIAGNOSIS — R29898 Other symptoms and signs involving the musculoskeletal system: Secondary | ICD-10-CM

## 2021-01-27 DIAGNOSIS — M25512 Pain in left shoulder: Secondary | ICD-10-CM

## 2021-01-27 NOTE — Patient Instructions (Signed)
Repeat all exercises 10-15 times, 1-2 times per day.  1) Shoulder Protraction    Begin with elbows by your side, slowly "punch" straight out in front of you.      2) Shoulder Flexion    Standing:         Begin with arms at your side with thumbs pointed up, slowly raise both arms up and forward towards overhead. Try to achieve shoulder level.         3) Horizontal abduction/adduction    Standing:           Begin with arms straight out in front of you, bring out to the side in at "T" shape. Keep arms straight entire time.      4) Internal & External Rotation      Standing:     Stand with elbows at the side and elbows bent 90 degrees. Move your forearms away from your body, then bring back inward toward the body.     5) Shoulder Abduction  Standing:       Lying on your back begin with your arms flat on the table next to your side. Slowly move your arms out to the side so that they go overhead, in a jumping jack or snow angel movement. Try to achieve shoulder height. Thumb will be leading you.

## 2021-01-27 NOTE — Therapy (Signed)
[place], [place], [address] Phone: [phone]   Fax:  [phone]  Occupational Therapy Treatment  Patient Details  Name: Brandy Leon MRN: [id number] Date of Birth: 01-Aug-1945 Referring Provider (OT): Dr. Sydnee Cabal   Encounter Date: 01/27/2021   OT End of Session - 01/27/21 1504     Visit Number 8    Number of Visits 16    Date for OT Re-Evaluation 02/23/21    Authorization Type [employer] Medicare, $30 copay    Authorization Time Period visits based on medical necessity    Progress Note Due on Visit 16    OT Start Time 1351    OT Stop Time 1427    OT Time Calculation (min) 36 min    Activity Tolerance Patient tolerated treatment well    Behavior During Therapy [facility] for tasks assessed/performed             History reviewed. No pertinent past medical history.  History reviewed. No pertinent surgical history.  There were no vitals filed for this visit.   Subjective Assessment - 01/27/21 1402     Subjective  S: One of the painful movements is crossing over to my right shoulder.    Currently in Pain? Yes    Pain Score 2     Pain Location Shoulder    Pain Orientation Left    Pain Descriptors / Indicators Aching;Sore    Pain Type Acute pain    Pain Onset 1 to 4 weeks ago    Pain Frequency Intermittent    Aggravating Factors  certain movements    Pain Relieving Factors rest, pain medication, muscle relaxers, ice    Effect of Pain on Daily Activities moderate effect    Multiple Pain Sites No                [facility] OT Assessment - 01/27/21 1403       Assessment   Medical Diagnosis s/p left arthroscopic RCR, SAD, DCR      Precautions   Precautions Shoulder    Type of Shoulder Precautions See protocol                      OT Treatments/Exercises (OP) - 01/27/21 1403       Exercises   Exercises Shoulder      Shoulder Exercises: Supine   Protraction PROM;5 reps;AROM;10  reps    Horizontal ABduction PROM;5 reps;AROM;10 reps    External Rotation PROM;5 reps;AROM;10 reps    Internal Rotation PROM;5 reps;AROM;12 reps    Flexion PROM;5 reps;AROM;10 reps    ABduction PROM;5 reps      Shoulder Exercises: Seated   Protraction AROM;10 reps    Horizontal ABduction AROM;10 reps    External Rotation AROM;10 reps    Internal Rotation AROM;10 reps    Flexion --      Shoulder Exercises: Standing   Flexion AROM;10 reps    Flexion Limitations to shoulder level    ABduction AROM;10 reps    ABduction Limitations to shoulder level      Shoulder Exercises: ROM/Strengthening   Wall Wash 1'    Proximal Shoulder Strengthening, Supine 10X each, no rest breaks                    OT Education - 01/27/21 1504     Education Details A/ROM standing/seated exercises    Person(s) Educated Patient    Methods Explanation;Demonstration;Handout  Comprehension Verbalized understanding;Returned demonstration              OT Short Term Goals - 01/27/21 1521       OT SHORT TERM GOAL #1   Title Pt will be provided with and educated on HEP to improve mobility of LUE required for ADL completion.    Time 4    Period Weeks    Target Date 01/25/21      OT SHORT TERM GOAL #2   Title Pt will increase LUE P/ROM to [facility] to improve ability to complete dressing tasks independently with minimal compensatory techniques.    Time 4    Period Weeks      OT SHORT TERM GOAL #3   Title Pt will increase LUE strength to 3/5 to improve ability to reach items at waist to chest height during ADLs.    Time 4    Period Weeks               OT Long Term Goals - 01/01/21 1436       OT LONG TERM GOAL #1   Title Pt will return to highest level of functioning using LUE as non-dominant during functional task completion.    Time 8    Period Weeks    Status On-going      OT LONG TERM GOAL #2   Title Pt will decrease LUE pain to 3/10 or less to improve ability to sleep in  the bed for 3+ hours without waking due to pain.    Time 8    Period Weeks    Status On-going      OT LONG TERM GOAL #3   Title Pt will decrease LUE fascial restrictions to min amounts or less to improve mobility required for overhead reaching tasks.    Time 8    Period Weeks    Status On-going      OT LONG TERM GOAL #4   Title Pt will increase LUE A/ROM to [facility] to improve ability to reach overhead and behind back during dressing and bathing tasks.    Time 8    Period Weeks    Status On-going      OT LONG TERM GOAL #5   Title Pt will increase LUE strength to 4+/5 to improve ability to perform unpacking duties and care for dogs using LUE as non-dominant.    Time 8    Period Weeks    Status On-going                   Plan - 01/27/21 1505     Clinical Impression Statement A: Omitted myofascial release due to low pain level and time constraint. Continues to have pain noted prior to hitting end range. Progressed to supine and seated A/ROM. HEP was updated. VC for form and technique were provided. Rest breaks taken as needed.    Body Structure / Function / Physical Skills ADL;Endurance;Muscle spasms;UE functional use;Fascial restriction;Pain;ROM;IADL;Strength;Mobility    Plan P: Follow up on HEP. Continue with A/ROM. Progress as able to tolerate.    OT Home Exercise Plan eval: table slides 11/10: A/ROM elbow, forearm, wrist 11/29: AA/ROM 12/6: A/ROM    Consulted and Agree with Plan of Care Patient             Patient will benefit from skilled therapeutic intervention in order to improve the following deficits and impairments:   Body Structure / Function / Physical Skills: ADL, Endurance, Muscle spasms, UE functional  use, Fascial restriction, Pain, ROM, IADL, Strength, Mobility       Visit Diagnosis: Stiffness of left shoulder, not elsewhere classified  Acute pain of left shoulder  Other symptoms and signs involving the musculoskeletal system    Problem  List There are no problems to display for this patient.   Ailene Ravel, OTR/L,CBIS  [phone]  01/27/2021, 3:21 PM  [facility] [address] [place], [place], [address] Phone: [phone]   Fax:  [phone]  Name: Itha Kroeker MRN: [id number] Date of Birth: [birth year]

## 2021-01-29 ENCOUNTER — Ambulatory Visit (HOSPITAL_COMMUNITY): Payer: Medicare Other | Admitting: Occupational Therapy

## 2021-02-03 ENCOUNTER — Telehealth (HOSPITAL_COMMUNITY): Payer: Self-pay | Admitting: Occupational Therapy

## 2021-02-03 ENCOUNTER — Ambulatory Visit (HOSPITAL_COMMUNITY): Payer: Medicare Other | Admitting: Occupational Therapy

## 2021-02-03 NOTE — Telephone Encounter (Signed)
Pt called she is not feeling well today please cx this apptment

## 2021-02-05 ENCOUNTER — Ambulatory Visit (HOSPITAL_COMMUNITY): Payer: Medicare Other

## 2021-02-10 ENCOUNTER — Encounter (HOSPITAL_COMMUNITY): Payer: Self-pay

## 2021-02-10 ENCOUNTER — Ambulatory Visit (HOSPITAL_COMMUNITY): Payer: Medicare Other

## 2021-02-10 ENCOUNTER — Other Ambulatory Visit: Payer: Self-pay

## 2021-02-10 DIAGNOSIS — M25612 Stiffness of left shoulder, not elsewhere classified: Secondary | ICD-10-CM

## 2021-02-10 DIAGNOSIS — M25512 Pain in left shoulder: Secondary | ICD-10-CM

## 2021-02-10 DIAGNOSIS — R29898 Other symptoms and signs involving the musculoskeletal system: Secondary | ICD-10-CM

## 2021-02-10 NOTE — Patient Instructions (Signed)
Complete the following exercises 2-3 times a day.     Internal Rotation Across Back  Grab the end of a towel with your affected side, palm facing backwards. Grab the towel with your unaffected side and pull your affected hand across your back until you feel a stretch in the front of your shoulder. If you feel pain, pull just to the pain, do not pull through the pain. Hold. Return your affected arm to your side. Try to keep your hand/arm close to your body during the entire movement.     Hold for 20-30 seconds. Complete 2 times.        Posterior Capsule Stretch   Stand or sit, one arm across body so hand rests over opposite shoulder. Gently push on crossed elbow with other hand until stretch is felt in shoulder of crossed arm. Hold _20-30__ seconds.  Repeat _2__ times per session.    Shoulder External Rotation- [place] Stretch  Stand facing door frame. Place whole forearm on frame with elbow at shoulder height. Turn away from the affected shoulder until a gentle stretch is felt.  Hold for 20-30 seconds. Complete 2 times.    -OR-  WALL EXTERNAL ROTATION STRETCH - ER  Start by standing in a doorway or at the corner of a wall and place your hand on the wall with your elbow bent as shown. Next, gently turn your body the opposite direction causing your shoulder to externally rotate until a stretch is felt. Hold, return to starting position and repeat. Hold for 20-30 seconds. Complete 2 times.

## 2021-02-10 NOTE — Therapy (Signed)
[place], [place], [address] Phone: [phone]   Fax:  [phone]  Occupational Therapy Treatment  Patient Details  Name: Brandy Leon MRN: [id number] Date of Birth: June 08, 1945 Referring Provider (OT): Dr. Sydnee Cabal   Encounter Date: 02/10/2021   OT End of Session - 02/10/21 1422     Visit Number 9    Number of Visits 16    Date for OT Re-Evaluation 02/23/21    Authorization Type [employer] Medicare, $30 copay    Authorization Time Period visits based on medical necessity    Progress Note Due on Visit 16    OT Start Time 1345    OT Stop Time 1423    OT Time Calculation (min) 38 min    Activity Tolerance Patient tolerated treatment well    Behavior During Therapy [facility] for tasks assessed/performed             History reviewed. No pertinent past medical history.  History reviewed. No pertinent surgical history.  There were no vitals filed for this visit.   Subjective Assessment - 02/10/21 1359     Subjective  S: I still have trouble making it across to the right shoulder without helping.    Currently in Pain? No/denies                [facility] Treatment OT Assessment - 02/10/21 1400       Assessment   Medical Diagnosis s/p left arthroscopic RCR, SAD, DCR      Precautions   Precautions Shoulder    Type of Shoulder Precautions See protocol                      OT Treatments/Exercises (OP) - 02/10/21 1400       Exercises   Exercises Shoulder      Shoulder Exercises: Supine   Protraction PROM;5 reps    Horizontal ABduction PROM;5 reps;AROM;15 reps    External Rotation PROM;5 reps    Internal Rotation PROM;5 reps    Flexion PROM;5 reps;AROM;15 reps    ABduction PROM;5 reps      Shoulder Exercises: Standing   Protraction AROM;12 reps    Horizontal ABduction AROM;12 reps    External Rotation AROM;12 reps    Internal Rotation AROM;12 reps    Flexion AROM;12 reps    ABduction --    too painful this session     Shoulder Exercises: Stretch   Internal Rotation Stretch 2 reps   20" horizontal towel   External Rotation Stretch 2 reps;20 seconds   completed with arm adducted and abducted                     OT Short Term Goals - 01/27/21 1521       OT SHORT TERM GOAL #1   Title Pt will be provided with and educated on HEP to improve mobility of LUE required for ADL completion.    Time 4    Period Weeks    Target Date 01/25/21      OT SHORT TERM GOAL #2   Title Pt will increase LUE P/ROM to [facility] to improve ability to complete dressing tasks independently with minimal compensatory techniques.    Time 4    Period Weeks      OT SHORT TERM GOAL #3   Title Pt will increase LUE strength to 3/5 to improve ability to reach items at waist to chest height during ADLs.  Time 4    Period Weeks               OT Long Term Goals - 01/01/21 1436       OT LONG TERM GOAL #1   Title Pt will return to highest level of functioning using LUE as non-dominant during functional task completion.    Time 8    Period Weeks    Status On-going      OT LONG TERM GOAL #2   Title Pt will decrease LUE pain to 3/10 or less to improve ability to sleep in the bed for 3+ hours without waking due to pain.    Time 8    Period Weeks    Status On-going      OT LONG TERM GOAL #3   Title Pt will decrease LUE fascial restrictions to min amounts or less to improve mobility required for overhead reaching tasks.    Time 8    Period Weeks    Status On-going      OT LONG TERM GOAL #4   Title Pt will increase LUE A/ROM to [facility] to improve ability to reach overhead and behind back during dressing and bathing tasks.    Time 8    Period Weeks    Status On-going      OT LONG TERM GOAL #5   Title Pt will increase LUE strength to 4+/5 to improve ability to perform unpacking duties and care for dogs using LUE as non-dominant.    Time 8    Period Weeks    Status On-going                    Plan - 02/10/21 1429     Clinical Impression Statement A: Continues to have pain noted prior to reaching end range stretch. Adjusted range of stretching based on pain tolerance. Added shoulder stretches to address horizontal adduction, IR and er as patient reports difficulty with these ranges. VC for form and technique were provided. Due to increased soreness unable to tolerate A/ROM abduction standing this session and it was omitted.    Body Structure / Function / Physical Skills ADL;Endurance;Muscle spasms;UE functional use;Fascial restriction;Pain;ROM;IADL;Strength;Mobility    Plan P: reassessment and send measurements to MD. Patient has a follow up appointment scheduled on 1/11 and wants to see what Dr. Theda Sers recommends before deciding if she needs more therapy. I let her know to call us and let us know what's decided after her appointment. Follow up on shoulder stretches.    OT Home Exercise Plan eval: table slides 11/10: A/ROM elbow, forearm, wrist 11/29: AA/ROM 12/6: A/ROM 12/20: Shoulder stretches    Consulted and Agree with Plan of Care Patient             Patient will benefit from skilled therapeutic intervention in order to improve the following deficits and impairments:   Body Structure / Function / Physical Skills: ADL, Endurance, Muscle spasms, UE functional use, Fascial restriction, Pain, ROM, IADL, Strength, Mobility       Visit Diagnosis: Stiffness of left shoulder, not elsewhere classified  Other symptoms and signs involving the musculoskeletal system  Acute pain of left shoulder    Problem List There are no problems to display for this patient.   Ailene Ravel, OTR/L,CBIS  [phone]  02/10/2021, 2:32 PM  [facility] [address] [place], [place], [address] Phone: [phone]   Fax:  [phone]  Name: Brandy Leon MRN: [id number]  Date of Birth: 1945-09-02

## 2021-02-12 ENCOUNTER — Encounter (HOSPITAL_COMMUNITY): Payer: Medicare Other

## 2021-02-17 ENCOUNTER — Encounter (HOSPITAL_COMMUNITY): Payer: Medicare Other | Admitting: Occupational Therapy

## 2021-02-19 ENCOUNTER — Other Ambulatory Visit: Payer: Self-pay

## 2021-02-19 ENCOUNTER — Encounter (HOSPITAL_COMMUNITY): Payer: Self-pay | Admitting: Occupational Therapy

## 2021-02-19 ENCOUNTER — Ambulatory Visit (HOSPITAL_COMMUNITY): Payer: Medicare Other | Admitting: Occupational Therapy

## 2021-02-19 DIAGNOSIS — R29898 Other symptoms and signs involving the musculoskeletal system: Secondary | ICD-10-CM

## 2021-02-19 DIAGNOSIS — M25612 Stiffness of left shoulder, not elsewhere classified: Secondary | ICD-10-CM

## 2021-02-19 DIAGNOSIS — M25512 Pain in left shoulder: Secondary | ICD-10-CM

## 2021-02-19 NOTE — Patient Instructions (Signed)
°  1) Flexion Wall Stretch    Face wall, place affected handon wall in front of you. Slide hand up the wall  and lean body in towards the wall. Hold for 10 seconds. Repeat 3-5 times. 1-2 times/day.     2) Towel Stretch with Internal Rotation       Gently pull up (or to the side) your affected arm  behind your back with the assist of a towel. Hold 10 seconds, repeat 3-5 times. 1-2 times/day.             3) Corner Stretch    Stand at a corner of a wall, place your arms on the walls with elbows bent. Lean into the corner until a stretch is felt along the front of your chest and/or shoulders. Hold for 10 seconds. Repeat 3-5X, 1-2 times/day.    4) Posterior Capsule Stretch    Bring the involved arm across chest. Grasp elbow and pull toward chest until you feel a stretch in the back of the upper arm and shoulder. Hold 10 seconds. Repeat 3-5X. Complete 1-2 times/day.    5) External Rotation Stretch:     Place your affected hand on the wall with the elbow bent and gently turn your body the opposite direction until a stretch is felt. Hold 10 seconds, repeat 3-5X. Complete 1-2 times/day.      Theraband Exercises:  1) (Home) Extension: Isometric / Bilateral Arm Retraction - Sitting   Facing anchor, hold hands and elbow at shoulder height, with elbow bent.  Pull arms back to squeeze shoulder blades together. Repeat 10-15 times. 1-3 times/day.   2) (Clinic) Extension / Flexion (Assist)   Face anchor, pull arms back, keeping elbow straight, and squeze shoulder blades together. Repeat 10-15 times. 1-3 times/day.   Copyright  V[employer]. All rights reserved.   3) (Home) Retraction: Row - Bilateral (Anchor)   Facing anchor, arms reaching forward, pull hands toward stomach, keeping elbows bent and at your sides and pinching shoulder blades together. Repeat 10-15 times. 1-3 times/day.   Copyright  VHI. All rights reserved.

## 2021-02-19 NOTE — Therapy (Addendum)
[place], [place], [address] Phone: [phone]   Fax:  [phone]  Occupational Therapy Reassessment & Treatment Recertification  Patient Details  Name: Brandy Leon MRN: [id number] Date of Birth: Aug 09, 1945 Referring Provider (OT): Dr. Sydnee Cabal   OCCUPATIONAL THERAPY DISCHARGE SUMMARY  Visits from Start of Care: 10  Current functional level related to goals / functional outcomes: See below. Pt has not returned since discharge and per chart review MD has approved home exercises.   Remaining deficits: Occasional pain, decreased strength and activity tolerance   Education / Equipment: HEP   Patient agrees to discharge. Patient goals were met. Patient is being discharged due to meeting the stated rehab goals..     Encounter Date: 02/19/2021   OT End of Session - 02/19/21 1410     Visit Number 10    Number of Visits 16    Date for OT Re-Evaluation 03/21/21    Authorization Type [employer] Medicare, $30 copay    Authorization Time Period visits based on medical necessity    Progress Note Due on Visit 16    OT Start Time 1304    OT Stop Time 1347    OT Time Calculation (min) 43 min    Activity Tolerance Patient tolerated treatment well    Behavior During Therapy [facility] for tasks assessed/performed             History reviewed. No pertinent past medical history.  History reviewed. No pertinent surgical history.  There were no vitals filed for this visit.   Subjective Assessment - 02/19/21 1306     Subjective  S: I do what I need to do.    Currently in Pain? Yes    Pain Score 1     Pain Location Shoulder    Pain Orientation Left    Pain Descriptors / Indicators Aching;Sore    Pain Type Acute pain    Pain Radiating Towards N/A    Pain Onset 1 to 4 weeks ago    Pain Frequency Intermittent    Aggravating Factors  certain movements    Pain Relieving Factors rest, pain medication, muscle  relaxers, ice    Effect of Pain on Daily Activities mod effect on ADLs    Multiple Pain Sites No                [facility] OT Assessment - 02/19/21 1306       Assessment   Medical Diagnosis s/p left arthroscopic RCR, SAD, DCR      Precautions   Precautions Shoulder      Observation/Other Assessments   Focus on Therapeutic Outcomes (FOTO)  61/100   50/100 previous     Palpation   Palpation comment min fascial restrictions along upper arm, anterior shoulder, trapezius, and scapular regions      AROM   Overall AROM Comments Assessed supine. IR/ er adducted. A/ROM not assessed prior to this date.    AROM Assessment Site Shoulder    Right/Left Shoulder Left    Left Shoulder Flexion 136 Degrees   132 previous   Left Shoulder ABduction 140 Degrees   90 previous   Left Shoulder Internal Rotation 90 Degrees   same as previous   Left Shoulder External Rotation 50 Degrees   20 previous     PROM   Overall PROM Comments Assessed supine, er/IR adducted    PROM Assessment Site Shoulder    Right/Left Shoulder Left    Left Shoulder  Flexion 125 Degrees   120 previous   Left Shoulder ABduction 151 Degrees   110 previous   Left Shoulder Internal Rotation 90 Degrees   same as previous   Left Shoulder External Rotation 50 Degrees   15 previous     Strength   Overall Strength Comments Assessed seated, er/IR adducted    Strength Assessment Site Shoulder    Right/Left Shoulder Left    Left Shoulder Flexion 4/5   not previously assessed   Left Shoulder ABduction 4-/5   not previously assessed   Left Shoulder Internal Rotation 5/5   not previously assessed   Left Shoulder External Rotation 4/5   not previously assessed                     OT Treatments/Exercises (OP) - 02/19/21 1307       Exercises   Exercises Shoulder      Shoulder Exercises: Supine   Protraction PROM;5 reps    Horizontal ABduction PROM;5 reps    External Rotation PROM;5 reps    Internal Rotation PROM;5  reps    Flexion PROM;5 reps    ABduction PROM;5 reps      Shoulder Exercises: Seated   Protraction AROM;10 reps    Horizontal ABduction AROM;10 reps    External Rotation AROM;10 reps    Internal Rotation AROM;10 reps    Flexion AROM;10 reps    Abduction AROM;10 reps                    OT Education - 02/19/21 1409     Education Details red scapular theraband, shoulder stretches    Person(s) Educated Patient    Methods Explanation;Demonstration;Handout    Comprehension Verbalized understanding;Returned demonstration              OT Short Term Goals - 02/19/21 1324       OT SHORT TERM GOAL #1   Title Pt will be provided with and educated on HEP to improve mobility of LUE required for ADL completion.    Time 4    Period Weeks    Status Achieved    Target Date 01/25/21      OT SHORT TERM GOAL #2   Title Pt will increase LUE P/ROM to [facility] to improve ability to complete dressing tasks independently with minimal compensatory techniques.    Time 4    Period Weeks      OT SHORT TERM GOAL #3   Title Pt will increase LUE strength to 3/5 to improve ability to reach items at waist to chest height during ADLs.    Time 4    Period Weeks               OT Long Term Goals - 02/19/21 1325       OT LONG TERM GOAL #1   Title Pt will return to highest level of functioning using LUE as non-dominant during functional task completion.    Time 8    Period Weeks    Status Achieved      OT LONG TERM GOAL #2   Title Pt will decrease LUE pain to 3/10 or less to improve ability to sleep in the bed for 3+ hours without waking due to pain.    Time 8    Period Weeks    Status Achieved      OT LONG TERM GOAL #3   Title Pt will decrease LUE fascial restrictions to min amounts or  less to improve mobility required for overhead reaching tasks.    Time 8    Period Weeks    Status Achieved      OT LONG TERM GOAL #4   Title Pt will increase LUE A/ROM to [facility] to improve  ability to reach overhead and behind back during dressing and bathing tasks.    Time 8    Period Weeks    Status Partially Met      OT LONG TERM GOAL #5   Title Pt will increase LUE strength to 4+/5 to improve ability to perform unpacking duties and care for dogs using LUE as non-dominant.    Time 8    Period Weeks    Status Partially Met                   Plan - 02/19/21 1410     Clinical Impression Statement A: Reassessment completed this session, pt has met all STGs and 3/5 LTGs. Pt has made great progress with ROM, pain, and functional use of LUE. Pt has improved her strength and is able to perform lifting and reaching tasks required for ADLs. Pt is completing HEP without difficulty, occasionally has pain or soreness with some movements however resolved quickly. Discussed current functioning with pt, pt is going to discussed HEP versus continued therapy with MD when she returns for follow up.    Body Structure / Function / Physical Skills ADL;Endurance;Muscle spasms;UE functional use;Fascial restriction;Pain;ROM;IADL;Strength;Mobility    Plan P: hold therapy until MD appt, pt can return and decrease to 1x/week    OT Home Exercise Plan eval: table slides 11/10: A/ROM elbow, forearm, wrist 11/29: AA/ROM 12/6: A/ROM 12/20: Shoulder stretches    Consulted and Agree with Plan of Care Patient             Patient will benefit from skilled therapeutic intervention in order to improve the following deficits and impairments:   Body Structure / Function / Physical Skills: ADL, Endurance, Muscle spasms, UE functional use, Fascial restriction, Pain, ROM, IADL, Strength, Mobility       Visit Diagnosis: Stiffness of left shoulder, not elsewhere classified  Other symptoms and signs involving the musculoskeletal system  Acute pain of left shoulder    Problem List There are no problems to display for this patient.   Guadelupe Sabin, OTR/L  [phone] 02/19/2021, 2:17  PM  [facility] [address] [place], [place], [address] Phone: [phone]   Fax:  [phone]  Name: Brandy Leon MRN: [id number] Date of Birth: February 11, 1946

## 2021-04-29 DIAGNOSIS — N3946 Mixed incontinence: Secondary | ICD-10-CM | POA: Insufficient documentation

## 2021-06-01 ENCOUNTER — Other Ambulatory Visit: Payer: Self-pay | Admitting: Specialist

## 2021-06-01 ENCOUNTER — Other Ambulatory Visit (HOSPITAL_COMMUNITY): Payer: Self-pay | Admitting: Specialist

## 2021-06-01 DIAGNOSIS — M25511 Pain in right shoulder: Secondary | ICD-10-CM

## 2021-06-18 ENCOUNTER — Ambulatory Visit (HOSPITAL_COMMUNITY)
Admission: RE | Admit: 2021-06-18 | Discharge: 2021-06-18 | Disposition: A | Discharge status: Home / Self Care | Payer: Medicare Other | Source: Ambulatory Visit | Attending: Specialist | Admitting: Specialist

## 2021-06-18 DIAGNOSIS — M25511 Pain in right shoulder: Secondary | ICD-10-CM | POA: Diagnosis present

## 2021-07-03 DIAGNOSIS — M19019 Primary osteoarthritis, unspecified shoulder: Secondary | ICD-10-CM | POA: Insufficient documentation

## 2021-07-06 DIAGNOSIS — M75120 Complete rotator cuff tear or rupture of unspecified shoulder, not specified as traumatic: Secondary | ICD-10-CM | POA: Insufficient documentation

## 2021-10-12 ENCOUNTER — Other Ambulatory Visit: Payer: Self-pay | Admitting: *Deleted

## 2021-10-12 DIAGNOSIS — D369 Benign neoplasm, unspecified site: Secondary | ICD-10-CM

## 2021-10-13 ENCOUNTER — Encounter: Payer: Self-pay | Admitting: General Surgery

## 2021-10-13 ENCOUNTER — Ambulatory Visit: Payer: Medicare Other | Admitting: General Surgery

## 2021-10-13 VITALS — BP 134/88 | HR 83 | Temp 98.2°F | Resp 16 | Ht 66.0 in | Wt 183.0 lb

## 2021-10-13 DIAGNOSIS — D369 Benign neoplasm, unspecified site: Secondary | ICD-10-CM | POA: Diagnosis not present

## 2021-10-13 NOTE — Patient Instructions (Signed)
Epidermoid Cyst Removal Epidermoid cyst removal is a procedure to remove a fluid-filled sac that forms under your skin (epidermoid cyst). This type of cyst is filled with a thick, oily substance (keratin) that is secreted by your skin glands. Epidermoid cysts may also be called epidermal cysts, or keratin cysts. Normally, the skin secretes this pasty material through a gland or a hair follicle. However, when a skin gland or hair follicle becomes blocked, an epidermoid cyst can form. You may need this procedure if you have an epidermal cyst that becomes large, uncomfortable, or inflamed. Tell a health care provider about: Any allergies you have. All medicines you are taking, including vitamins, herbs, eye drops, creams, and over-the-counter medicines. Any problems you or family members have had with anesthetic medicines. Any blood disorders you have. Any surgeries you have had. Any medical conditions you have now or have had. Whether you are pregnant or may be pregnant. What are the risks? Generally, this is a safe procedure. However, problems may occur, including: Recurrence of the cyst. Bleeding. Infection. Scarring. What happens before the procedure? Ask your health care provider about: Changing or stopping your regular medicines. This is especially important if you are taking diabetes medicines or blood thinners. Taking medicines such as aspirin and ibuprofen. These medicines can thin your blood. Do not take these medicines unless your health care provider tells you to take them. Taking over-the-counter medicines, vitamins, herbs, and supplements. If you have an inflamed or infected cyst, you may have to take antibiotic medicine before the cyst removal. Take your antibiotic as told by your health care provider. Do not stop taking the antibiotic even if you start to feel better. Take a shower on the morning of your procedure. Your health care provider may ask you to use a germ-killing  soap. What happens during the procedure?  You will be given a medicine to numb the area (local anesthetic). The skin around the cyst will be cleaned with a germ-killing solution. The health care provider will make a small incision in your skin over the cyst. The health care provider will separate the cyst from the surrounding tissues that are under your skin. If possible, the cyst will be removed undamaged (intact). If the cyst bursts (ruptures), it will be removed in pieces. After the cyst is removed, the health care provider will control any bleeding and close the incision with small stitches (sutures). Small incisions may not need sutures, and the bleeding will be controlled by applying direct pressure with gauze. The health care provider may apply antibiotic ointment and a bandage (dressing) over the incision. The procedure may vary among health care providers and hospitals. What happens after the procedure? If you are prescribed an antibiotic medicine or ointment, take or apply it as told by your health care provider. Do not stop using the antibiotic even if you start to feel better. Summary Epidermoid cyst removal is a procedure to remove a sac that has formed under your skin. You may need this procedure if you have an epidermoid cyst that becomes large, uncomfortable, or inflamed. The health care provider will make a small incision in your skin to remove the cyst. If you are prescribed an antibiotic medicine before the procedure, after the procedure, or both, use the antibiotic as told by your health care provider. Do not stop using the antibiotic even if you start to feel better. This information is not intended to replace advice given to you by your health care provider. Make sure  you discuss any questions you have with your health care provider. Document Revised: 05/16/2019 Document Reviewed: 05/16/2019 Elsevier Patient Education  [place].

## 2021-10-13 NOTE — Progress Notes (Signed)
[facility] History and Physical  Reason for Referral: Dermoid cyst on the left thigh  Referring Physician:  Pablo Lawrence, NP   Chief Complaint   New Patient (Initial Visit)     Brandy Leon is a 76 y.o. female.  HPI: [name] is a sweet 76 yo who comes in with a left thigh dermoid cyst. She has not had any signs of redness or swelling but has noticed a hardened area for a few years. This area is getting harder and she notices it when she crosses her legs or when she shaves. She has some varicose veins lower but nothing higher.   Past Medical History:  Diagnosis Date   Allergy    Anxiety    Arthritis    Asthma    Depression    Diabetes mellitus without complication ([place])     Past Surgical History:  Procedure Laterality Date   ABDOMINAL HYSTERECTOMY      History reviewed. No pertinent family history.  Social History   Tobacco Use   Smoking status: Former    Types: Cigarettes    Passive exposure: Never   Smokeless tobacco: Never  Vaping Use   Vaping Use: Never used  Substance Use Topics   Alcohol use: Not Currently   Drug use: Never    Medications: I have reviewed the patient's current medications. Allergies as of 10/13/2021       Reactions   Codeine Nausea And Vomiting, Rash, Shortness Of Breath   Oxytetracycline Rash, Shortness Of Breath   Penicillin G Rash, Shortness Of Breath   Sulfa Antibiotics Rash, Shortness Of Breath   Tape Rash   Bupropion Hives   Other Rash   Reaction to chlorahexadine prep or dermabond glue. Happed after hernia surgery in Nov 2017        Medication List        Accurate as of October 13, 2021 11:14 AM. If you have any questions, ask your nurse or doctor.          albuterol 108 (90 Base) MCG/ACT inhaler Commonly known as: VENTOLIN HFA INHALE 2 PUFFS INTO THE LUNGS EVERY 6 HOURS AS NEEDED FOR WHEEZING   Cholecalciferol 25 MCG (1000 UT) capsule Take by mouth.   cyanocobalamin 100 MCG  tablet Take by mouth.   escitalopram 10 MG tablet Commonly known as: LEXAPRO Take 1 tablet by mouth daily.   ibuprofen 200 MG tablet Commonly known as: ADVIL Take by mouth.   loperamide 2 MG capsule Commonly known as: IMODIUM Take by mouth.   montelukast 10 MG tablet Commonly known as: SINGULAIR   nitroGLYCERIN 0.4 MG SL tablet Commonly known as: NITROSTAT Place under the tongue.   pantoprazole 40 MG tablet Commonly known as: PROTONIX   Tums 500 MG chewable tablet Generic drug: calcium carbonate Chew 1 tablet by mouth daily as needed.         ROS:  A comprehensive review of systems was negative except for: Gastrointestinal: positive for reflux symptoms Genitourinary: positive for frequency Neurological: positive for numbness left hand  Blood pressure 134/88, pulse 83, temperature 98.2 F (36.8 C), temperature source Oral, resp. rate 16, height '5\' 6"'$  (1.676 m), weight 183 lb (83 kg), SpO2 96 %. Physical Exam Vitals reviewed.  Constitutional:      Appearance: Normal appearance.  HENT:     Head: Normocephalic.     Nose: Nose normal.  Eyes:     Extraocular Movements: Extraocular movements intact.  Cardiovascular:     Rate  and Rhythm: Normal rate and regular rhythm.  Pulmonary:     Effort: Pulmonary effort is normal.     Breath sounds: Normal breath sounds.  Abdominal:     Palpations: Abdomen is soft.     Tenderness: There is no abdominal tenderness.  Musculoskeletal:        General: No swelling.     Cervical back: Normal range of motion.     Comments: Left inner thigh 1.5cm hardened area, no erythema or drainage, no signs of central pit   Skin:    General: Skin is warm.  Neurological:     General: No focal deficit present.     Mental Status: She is alert. Mental status is at baseline.  Psychiatric:        Mood and Affect: Mood normal.     Results: None  Assessment & Plan:  [name] is a 76 y.o. female with what could be a cyst or could be  a thrombosed varices. We discussed excision in the office with local and suture that will stay in to be removed. Discussed risk of bleeding, infection, or finding something other than a cyst.   She will call to make the appt for the excision.     Virl Cagey 10/13/2021, 11:14 AM

## 2021-11-26 ENCOUNTER — Encounter: Payer: Self-pay | Admitting: General Surgery

## 2021-11-26 ENCOUNTER — Encounter: Payer: Self-pay | Admitting: *Deleted

## 2021-11-26 ENCOUNTER — Other Ambulatory Visit: Payer: Self-pay

## 2021-11-26 ENCOUNTER — Ambulatory Visit (INDEPENDENT_AMBULATORY_CARE_PROVIDER_SITE_OTHER): Payer: Medicare Other | Admitting: General Surgery

## 2021-11-26 VITALS — BP 108/68 | HR 64 | Temp 98.1°F | Resp 18 | Ht 66.0 in | Wt 184.0 lb

## 2021-11-26 DIAGNOSIS — D369 Benign neoplasm, unspecified site: Secondary | ICD-10-CM | POA: Diagnosis not present

## 2021-11-26 DIAGNOSIS — M7989 Other specified soft tissue disorders: Secondary | ICD-10-CM | POA: Diagnosis not present

## 2021-11-26 NOTE — Patient Instructions (Addendum)
Come by office on either Monday 10/16 or Tuesday 10/17 for suture removal with nurse.  Tylenol or ibuprofen for pain control. Keep area covered with 2 days with the bandage. Bird bath between now and then.  You can put neosporin on the area after that and replace bandaid if desired.

## 2021-11-26 NOTE — Progress Notes (Signed)
[facility] Procedure Note  11/26/21  Pre-procedure Diagnosis:  Left thigh cyst /mass    Post-procedure Diagnosis: Same   Procedure(s) Performed: Excision cyst left thigh 1.5cm   Surgeon: Ria Comment C. Constance Haw, MD   Assistants: No qualified resident was available    Anesthesia: Lidocaine 1%    Specimens: Cyst    Estimated Blood Loss: Minimal  Wound Class: Clean   Procedure Indications: Brandy Leon is a 76 yo with a cyst on her left thigh that she feels like has gotten bigger and bothers her when she has her legs rub together. We discussed excision and risk of bleeding, infection, recurrence, finding something other than a cyst.   Findings: 1.5cm hard mass with some fat around, possible cyst versus hard lipoma    Procedure: The patient was taken to the procedure room and placed in the frog leg position with the left inner thigh exposed. The left thigh was prepared and draped in the usual sterile fashion. Lidocaine 1% was injected.   An elliptical incision around the mass was performed and carried down into the subcutaneous tissue.  The mass was 1.5cm and was excised in its entirety without disruption. The cavity was made hemostatic. The deep layer was closed with 3-0 Vicryl interrupted and the skin was closed with 3-0 Nylon interrupted and covered with neosporin and a dressing.   Final inspection revealed acceptable hemostasis. The patient tolerated the procedure well.   Come by office on either Monday 10/16 or Tuesday 10/17 for suture removal with nurse.  Tylenol or ibuprofen for pain control. Keep area covered with 2 days with the bandage. Bird bath between now and then.  You can put neosporin on the area after that and replace bandaid if desired.   Brandy Labrum, MD [facility] [address] [place], [place] [address] [phone] (office)

## 2021-12-01 LAB — PATHOLOGY REPORT

## 2021-12-01 LAB — TISSUE SPECIMEN

## 2021-12-01 NOTE — Progress Notes (Signed)
Can you let the patient know she has a schwannoma. A schwannoma is a type of nerve tumor of the nerve sheath. It's the most common type of benign peripheral nerve tumor in adults. It can occur anywhere in your body, at any age. A schwannoma typically comes from a single bundle (fascicle) within the main nerve and displaces the rest of the nerve. This is Benign. Nothing to worry about or do anything more about.

## 2022-08-08 IMAGING — MR MR SHOULDER*R* W/O CM
5 series · 40 of 40 positions shown · non-contrast
Comparison: None.

CLINICAL DATA: Right shoulder pain. History of right shoulder
surgery in 7177

EXAM:
MRI OF THE RIGHT SHOULDER WITHOUT CONTRAST
TECHNIQUE: Multiplanar, multisequence MR imaging of the shoulder was performed.
No intravenous contrast was administered.

[Series 2: T2 fat-sat · axial · right · 4.0mm · 0.49mm/px · z∈[-5,+84]mm · 8 of 20 slices shown (1 of 3)]
[im 1/20]
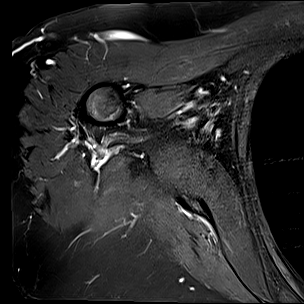
[im 3/20]
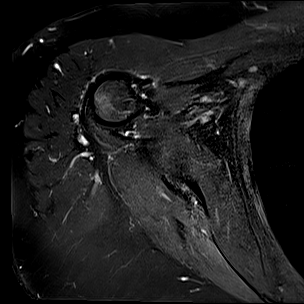
[im 6/20]
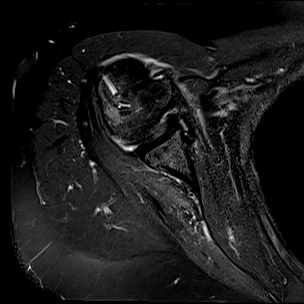
[im 9/20]
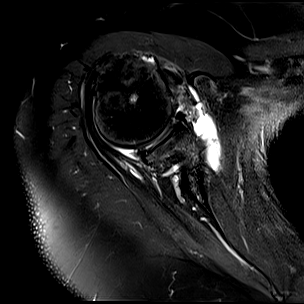
[im 11/20]
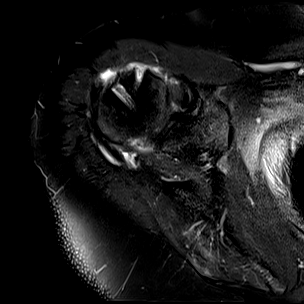
[im 14/20]
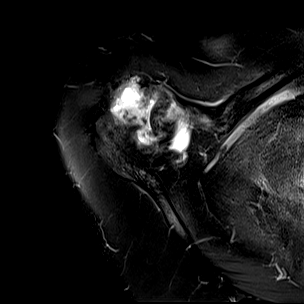
[im 17/20]
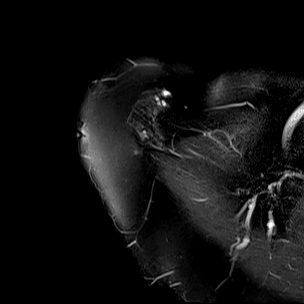
[im 20/20]
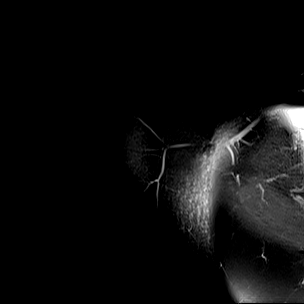

[Series 3: T1 · oblique · right · 4.0mm · 0.41mm/px · 8 of 19 slices shown]
[im 1/19]
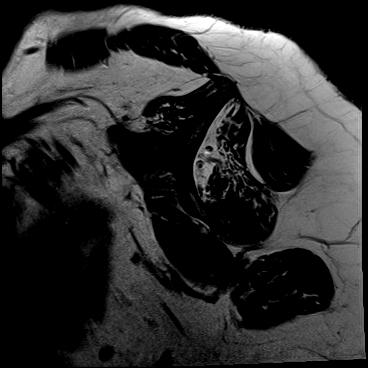
[im 3/19]
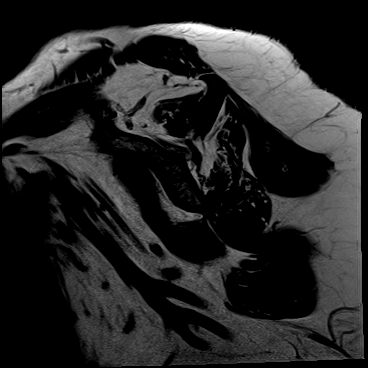
[im 6/19]
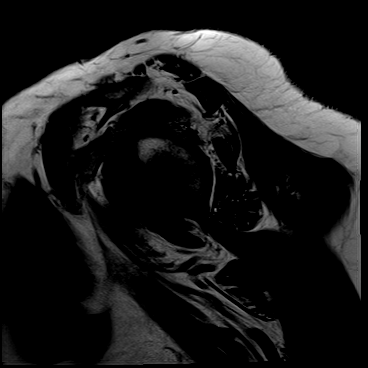
[im 8/19]
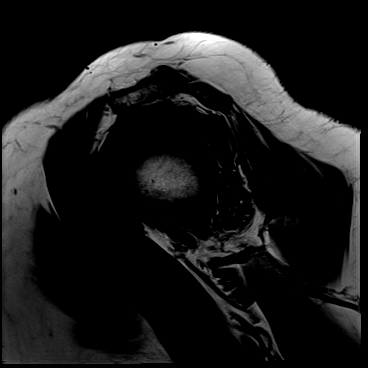
[im 11/19]
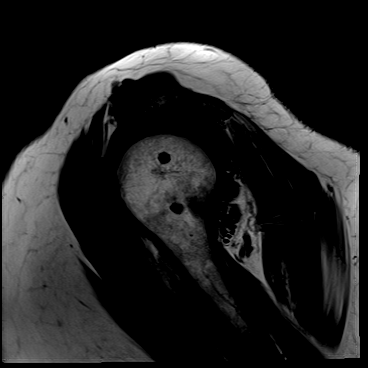
[im 13/19]
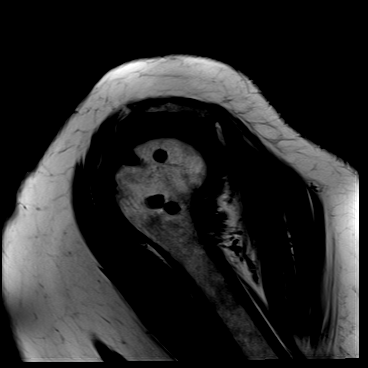
[im 16/19]
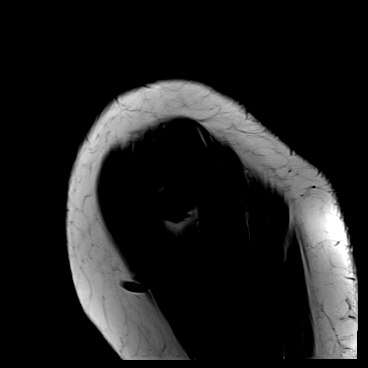
[im 19/19]
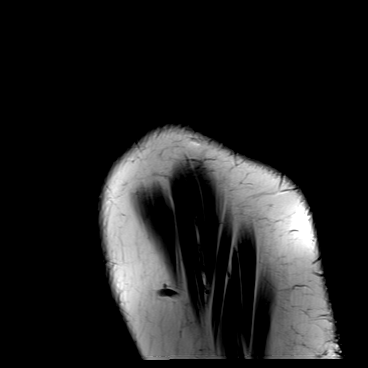

[Series 4: T2 fat-sat · oblique · right · 4.0mm · 0.47mm/px · 8 of 19 slices shown (2 of 3)]
[im 1/19]
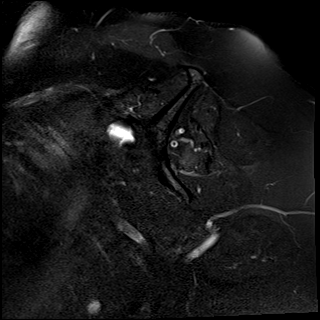
[im 3/19]
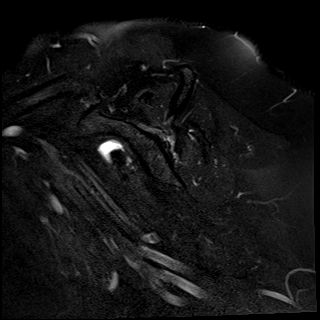
[im 6/19]
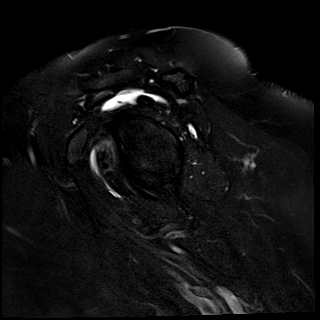
[im 8/19]
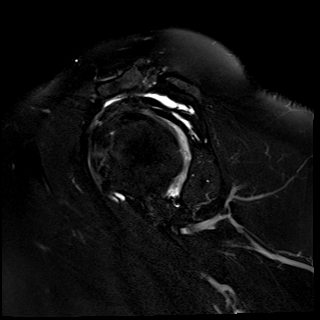
[im 11/19]
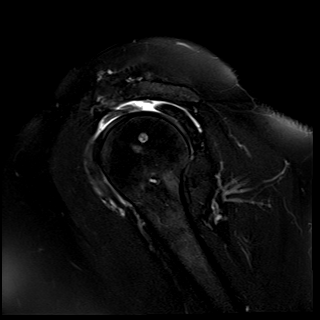
[im 13/19]
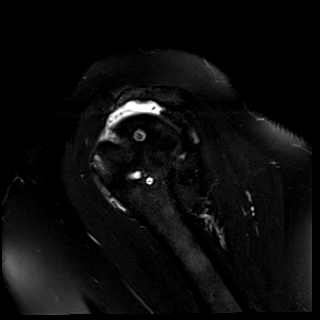
[im 16/19]
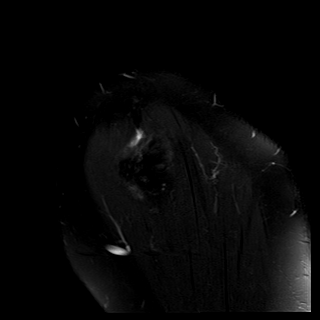
[im 19/19]
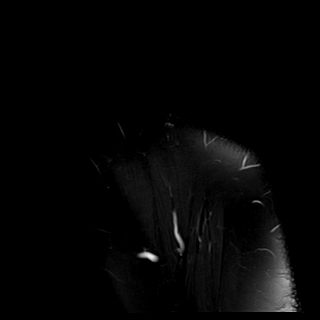

[Series 5: T2 fat-sat · oblique · right · 4.0mm · 0.47mm/px · 8 of 19 slices shown (3 of 3)]
[im 1/19]
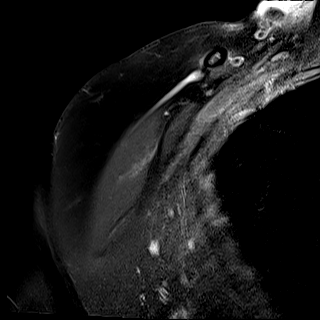
[im 3/19]
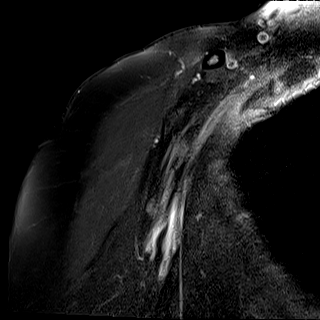
[im 6/19]
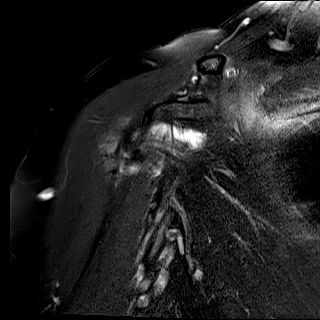
[im 8/19]
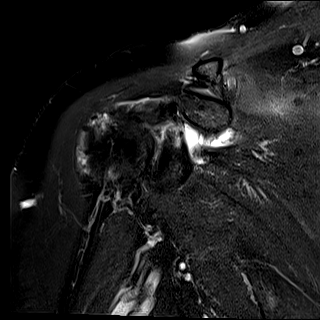
[im 11/19]
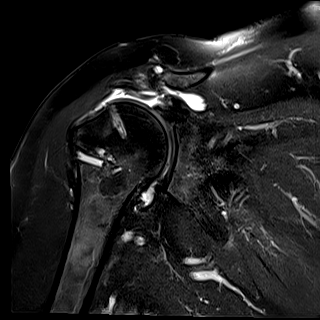
[im 13/19]
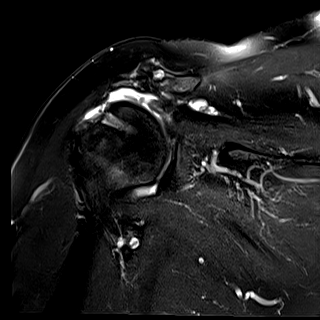
[im 16/19]
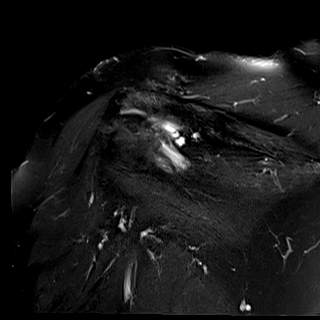
[im 19/19]
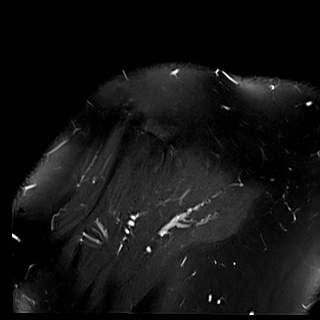

[Series 6: PD · oblique · right · 4.0mm · 0.43mm/px · 8 of 19 slices shown]
[im 1/19]
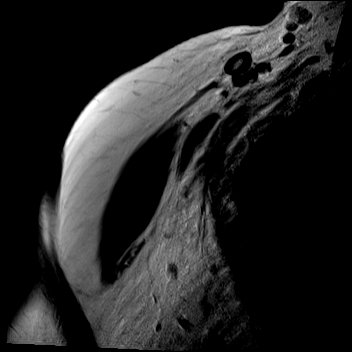
[im 3/19]
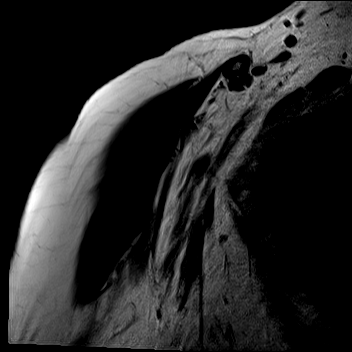
[im 6/19]
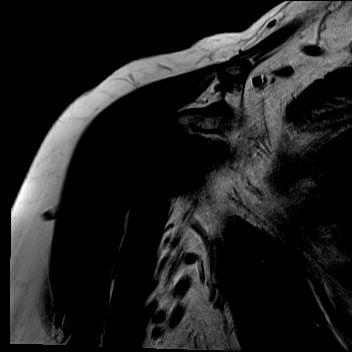
[im 8/19]
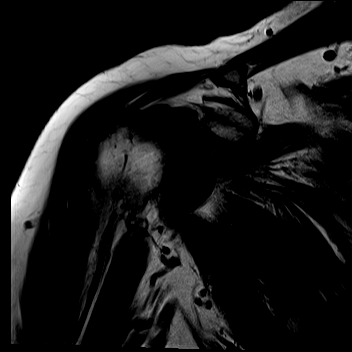
[im 11/19]
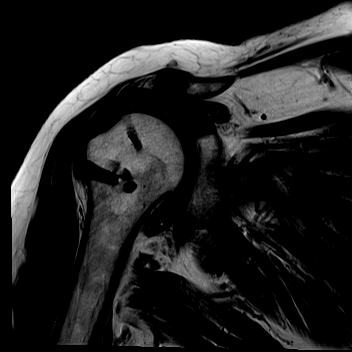
[im 13/19]
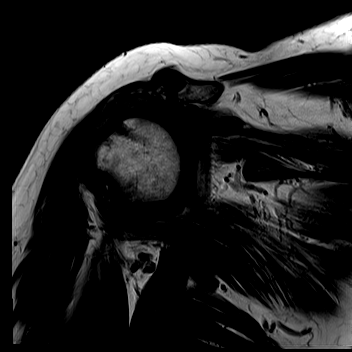
[im 16/19]
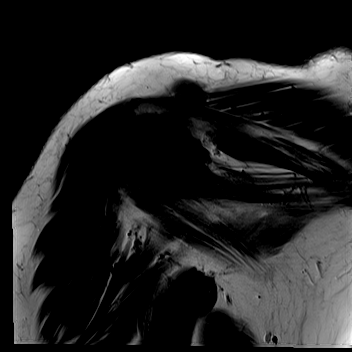
[im 19/19]
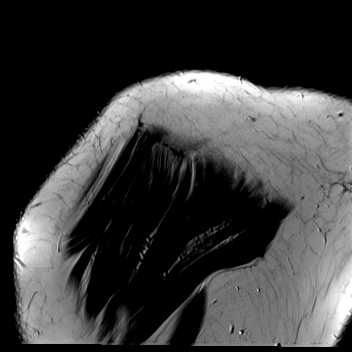

[40 of 40 positions shown; findings below may reference images not displayed]

FINDINGS: Rotator cuff: Postsurgical changes from a previous rotator cuff
repair. Complete full-thickness tears of the supraspinatus and
infraspinatus tendons with retraction of 1.5-2.5 cm. Severe
tendinosis of the distal subscapularis tendon. Teres minor intact.

Muscles:  Supraspinatus and infraspinatus muscle atrophy.

Biceps long head: Long head biceps tendon is medially perched at the
groove entry zone with intra-articular biceps tendinosis.

Acromioclavicular Joint: Moderate-advanced degenerative changes of
the AC joint. Small volume subacromial-subdeltoid bursal fluid
communicating with the glenohumeral joint.

Glenohumeral Joint: Trace glenohumeral joint effusion. Mild chondral
thinning.

Labrum:  Superior labral degeneration.  No paralabral cyst.

Bones: No acute fracture. No dislocation. No bone marrow edema. No
marrow replacing bone lesion.

Other: None.
IMPRESSION: 1. Postsurgical changes from previous rotator cuff repair. Complete
full-thickness retracted tears of the supraspinatus and
infraspinatus tendons with associated muscle atrophy.
2. Severe tendinosis of the distal subscapularis tendon.
3. Medial subluxation of the long head biceps tendon at the groove
entry zone with intra-articular biceps tendinosis.
4. Moderate-advanced degenerative changes of the AC joint.

## 2023-05-09 ENCOUNTER — Encounter: Payer: Self-pay | Admitting: Internal Medicine

## 2023-05-09 ENCOUNTER — Ambulatory Visit: Payer: Medicare Other | Admitting: Internal Medicine

## 2023-05-09 VITALS — BP 142/80 | HR 75 | Temp 98.0°F | Resp 14 | Ht 63.58 in | Wt 186.2 lb

## 2023-05-09 DIAGNOSIS — L272 Dermatitis due to ingested food: Secondary | ICD-10-CM | POA: Diagnosis not present

## 2023-05-09 DIAGNOSIS — Z91018 Allergy to other foods: Secondary | ICD-10-CM | POA: Diagnosis not present

## 2023-05-09 DIAGNOSIS — J4520 Mild intermittent asthma, uncomplicated: Secondary | ICD-10-CM | POA: Diagnosis not present

## 2023-05-09 DIAGNOSIS — J3089 Other allergic rhinitis: Secondary | ICD-10-CM

## 2023-05-09 MED ORDER — ALBUTEROL SULFATE HFA 108 (90 BASE) MCG/ACT IN AERS
1.0000 | INHALATION_SPRAY | Freq: Four times a day (QID) | RESPIRATORY_TRACT | 1 refills | Status: AC | PRN
Start: 2023-05-09 — End: ?

## 2023-05-09 MED ORDER — TRIAMCINOLONE ACETONIDE 0.1 % EX OINT
TOPICAL_OINTMENT | CUTANEOUS | 5 refills | Status: AC
Start: 2023-05-09 — End: ?

## 2023-05-09 NOTE — Patient Instructions (Addendum)
 Dermatitis:  - Do a daily soaking tub bath in warm water for 10-15 minutes.  - Use a gentle, unscented cleanser at the end of the bath (such as Dove unscented bar or baby wash, or Aveeno sensitive body wash). Then rinse, pat half-way dry, and apply a gentle, unscented moisturizer cream or ointment (Cerave, Cetaphil, Eucerin, Aveeno, Aquaphor, Vanicream, Vaseline)  all over while still damp. Dry skin makes the itching and rash of eczema worse. The skin should be moisturized with a gentle, unscented moisturizer at least twice daily.  - Use only unscented liquid laundry detergent. - Apply prescribed topical steroid (triamcinolone 0.1% below neck) to flared areas (red and thickened eczema) after the moisturizer has soaked into the skin (wait at least 30 minutes). Taper off the topical steroids as the skin improves. Do not use topical steroid for more than 7-10 days at a time.  - Will consider patch testing.   - If no improvement, will need Dermatology evaluation.   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  - Please get bloodwork when you have a chance.  - Initial rxn: rashes and swelling  - for SKIN only reaction, okay to take Benadryl 2  teaspoonful every 6 hours as needed.  Mild Intermittent Asthma: - Rescue inhaler: Albuterol 2 puffs every 4-6 hours as needed for respiratory symptoms of shortness of breath, or wheezing Asthma control goals:  Full participation in all desired activities (may need albuterol before activity) Albuterol use two times or less a week on average (not counting use with activity) Cough interfering with sleep two times or less a month Oral steroids no more than once a year No hospitalizations  Allergic Rhinitis: - Use nasal saline rinses before nose sprays such as with Neilmed Sinus Rinse.  Use distilled water.   - Use Zyrtec 10 mg daily.    Hold all anti-histamines (Xyzal, Allegra, Zyrtec, Claritin, Benadryl, Pepcid) 3 days prior to next visit.  Follow up:  130 on 3/24 for skin testing 1-68

## 2023-05-09 NOTE — Progress Notes (Signed)
 NEW PATIENT  Date of Service/Encounter:  05/09/23  Consult requested by: Roe Rutherford, NP   Subjective:   Brandy Leon (DOB: 14-Apr-1945) is a 78 y.o. female who presents to the clinic on 05/09/2023 with a chief complaint of Rash, Pruritus, and Alpha Gal .    History obtained from: chart review and patient.   Rashes: Ongoing for a few years.  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 Never had respiratory or GI symptoms, never required Epipen.  Has had tick bites in the past.  However, despite avoidance of all mammalian meat, she has noted recurrence of them about a few weeks ago.  Mostly on face, groin, chest, stomach.  They are very itchy, red, bumps.   Triamcinolone and prednisone do help.  Also taking Claritin.  Did not use Pepcid/Hydroxyzine.  Never seen Dermatology  No new products or medications.  She is worried about a possible food allergy but can't pinpoint anything specific that causes the rash to occur.  Eats seafood frequently, has not noted any correlation.   Asthma:  Diagnosed at age 46s.  Usually only has symptoms with exposure to cats or illness. Only has used Albuterol a few times this last year.  none daytime symptoms in past month, none nighttime awakenings in past month Using rescue inhaler: rarely Limitations to daily activity: none 0 ED visits/UC visits and 0 oral steroids in the past year 0 number of lifetime hospitalizations, 0 number of lifetime intubations.  Identified Triggers:  cats and respiratory illness Prior PFTs or spirometry: none Previously used therapies: never  Current regimen:  Maintenance: none  Rescue: Albuterol 2 puffs q4-6 hrs PRN  Rhinitis:  Started since she was young.  Symptoms include: nasal congestion and rhinorrhea  Occurs year-round Potential triggers: cats  Treatments tried:  Claritin daily  Singulair in the past  Previous allergy testing: yes when she was younger.   History of sinus surgery: no Nonallergic triggers: none    Reviewed:  03/29/2023: seen by Arturo Morton NP for rash, previously thought to be alpha gal but still having it despite avoidance of mammalian meat.  Started on hydroxyzine PRN, depo shot x1 80mg , Claritin 10mg  daily, Pepcid 20mg  BID.   06/15/2022: seen in urgent care for a week hx of rash all over, very itchy.  Maculopapular on exam.  Discussed possibly contact dermatitis.  Given prednisone taper and solumedrol with PRN topical steroids.  PMH includes asthma and allergies, medications include Albuterol, Singulair, Claritin, topical triamcinolone.   Past Medical History: Past Medical History:  Diagnosis Date   Allergy    Anxiety    Arthritis    Asthma    Depression    Diabetes mellitus without complication (HCC)    Past Surgical History: Past Surgical History:  Procedure Laterality Date   ABDOMINAL HYSTERECTOMY     TONSILLECTOMY      Family History: Family History  Problem Relation Age of Onset   Urticaria Daughter    Allergic rhinitis Neg Hx    Asthma Neg Hx    Eczema Neg Hx     Social History:  Flooring in bedroom: tile Pets: dog Tobacco use/exposure: none Job: retired   Medication List:  Allergies as of 05/09/2023       Reactions   Codeine Nausea And Vomiting, Rash, Shortness Of Breath   Oxytetracycline Rash, Shortness Of Breath   Penicillin G Rash, Shortness Of Breath   Sulfa Antibiotics Rash, Shortness Of Breath  Tape Rash   Bupropion Hives   Alpha-gal    Other Rash   Reaction to chlorahexadine prep or dermabond glue. Happed after hernia surgery in Nov 2017        Medication List        Accurate as of May 09, 2023 12:36 PM. If you have any questions, ask your nurse or doctor.          STOP taking these medications    famotidine 20 MG tablet Commonly known as: PEPCID Stopped by: Birder Robson   hydrOXYzine 25 MG tablet Commonly known as: ATARAX Stopped by: Birder Robson    nitroGLYCERIN 0.4 MG SL tablet Commonly known as: NITROSTAT Stopped by: Birder Robson   solifenacin 10 MG tablet Commonly known as: VESICARE Stopped by: Birder Robson   triamcinolone cream 0.1 % Commonly known as: KENALOG Replaced by: triamcinolone ointment 0.1 % Stopped by: Birder Robson       TAKE these medications    albuterol 108 (90 Base) MCG/ACT inhaler Commonly known as: VENTOLIN HFA Inhale 1-2 puffs into the lungs every 6 (six) hours as needed for wheezing or shortness of breath. What changed: See the new instructions. Changed by: Birder Robson   Cholecalciferol 25 MCG (1000 UT) capsule Take by mouth.   cyanocobalamin 100 MCG tablet Take by mouth.   escitalopram 10 MG tablet Commonly known as: LEXAPRO Take 1 tablet by mouth daily.   ibuprofen 200 MG tablet Commonly known as: ADVIL Take by mouth.   loperamide 2 MG capsule Commonly known as: IMODIUM Take by mouth.   loratadine 10 MG tablet Commonly known as: CLARITIN Take 10 mg by mouth daily.   montelukast 10 MG tablet Commonly known as: SINGULAIR   pantoprazole 40 MG tablet Commonly known as: PROTONIX   triamcinolone ointment 0.1 % Commonly known as: KENALOG Apply twice daily for flare ups below neck, maximum 10 days. Replaces: triamcinolone cream 0.1 % Started by: Birder Robson   Tums 500 MG chewable tablet Generic drug: calcium carbonate Chew 1 tablet by mouth daily as needed.         REVIEW OF SYSTEMS: Pertinent positives and negatives discussed in HPI.   Objective:   Physical Exam: BP (!) 142/80   Pulse 75   Temp 98 F (36.7 C)   Resp 14   Ht 5' 3.58" (1.615 m)   Wt 186 lb 4 oz (84.5 kg)   SpO2 98%   BMI 32.39 kg/m  Body mass index is 32.39 kg/m. GEN: alert, well developed HEENT: clear conjunctiva,  nose with + mild inferior turbinate hypertrophy, pink nasal mucosa, slight clear rhinorrhea HEART: regular rate and rhythm, no murmur LUNGS: clear to auscultation  bilaterally, no coughing, unlabored respiration ABDOMEN: soft, non distended  SKIN: few scattered papular erythematous lesions on abdomen   Spirometry:  Tracings reviewed. Her effort: Good reproducible efforts. FVC: 2.07L, 82% predicted FEV1: 1.68L, 86% predicted FEV1/FVC ratio: 81% Interpretation: Spirometry consistent with normal pattern.  Please see scanned spirometry results for details.  Assessment:   1. Other allergic rhinitis   2. 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 Mild intermittent asthma without complication     Plan/Recommendations:  Dermatitis:  - Unclear etiology; will keep contact dermatitis in differential.  - Do a daily soaking tub bath in warm water for 10-15 minutes.  - Use a gentle, unscented cleanser at the end of the bath (such as [employer]  unscented bar or baby wash, or Aveeno sensitive body wash). Then rinse, pat half-way dry, and apply a gentle, unscented moisturizer cream or ointment (Cerave, Cetaphil, Eucerin, Aveeno, Aquaphor, Vanicream, Vaseline)  all over while still damp. Dry skin makes the itching and rash worse. The skin should be moisturized with a gentle, unscented moisturizer at least twice daily.  - Use only unscented liquid laundry detergent. - Apply prescribed topical steroid (triamcinolone 0.1% below neck) to flared areas after the moisturizer has soaked into the skin (wait at least 30 minutes). Taper off the topical steroids as the skin improves. Do not use topical steroid for more than 7-10 days at a time.  - Will consider patch testing and Dermatology referral. She has an upcoming surgery in early April so would likely do patch testing 1-2 weeks after.   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  - Please get bloodwork when you have a chance; will repeat alpha gal IgE - Initial rxn: rashes and swelling  - for SKIN only reaction, okay to take Benadryl 2  teaspoonful every 6 hours as needed.  Mild Intermittent  Asthma: - MDI technique discussed.  Spirometry today normal. Infrequent symptoms/albuterol use with mostly infections or cat exposure. - Rescue inhaler: Albuterol 2 puffs every 4-6 hours as needed for respiratory symptoms of shortness of breath, or wheezing Asthma control goals:  Full participation in all desired activities (may need albuterol before activity) Albuterol use two times or less a week on average (not counting use with activity) Cough interfering with sleep two times or less a month Oral steroids no more than once a year No hospitalizations  Allergic Rhinitis: - Due to turbinate hypertrophy, recurrent rashes, asthma and unresponsive to over the counter meds, will perform skin testing to identify aeroallergen triggers.   - Use nasal saline rinses before nose sprays such as with Neilmed Sinus Rinse.  Use distilled water.   - Use Zyrtec 10 mg daily.  Will likely switch from Claritin.    Hold all anti-histamines (Xyzal, Allegra, Zyrtec, Claritin, Benadryl, Pepcid) 3 days prior to next visit.  Follow up: 130 on 3/24 for skin testing 1-68    Alesia Morin, MD [facility]

## 2023-05-14 LAB — ALPHA-GAL PANEL
Allergen Lamb IgE: 1.23 kU/L — AB
Beef IgE: 2.77 kU/L — AB
IgE (Immunoglobulin E), Serum: 98 [IU]/mL (ref 6–495)
O215-IgE Alpha-Gal: 3.51 kU/L — AB
Pork IgE: 1.3 kU/L — AB

## 2023-05-16 ENCOUNTER — Ambulatory Visit: Admitting: Internal Medicine

## 2023-05-16 DIAGNOSIS — J301 Allergic rhinitis due to pollen: Secondary | ICD-10-CM

## 2023-05-16 DIAGNOSIS — L272 Dermatitis due to ingested food: Secondary | ICD-10-CM | POA: Diagnosis not present

## 2023-05-16 DIAGNOSIS — J3081 Allergic rhinitis due to animal (cat) (dog) hair and dander: Secondary | ICD-10-CM

## 2023-05-16 MED ORDER — FLUTICASONE PROPIONATE 50 MCG/ACT NA SUSP
2.0000 | Freq: Every day | NASAL | 5 refills | Status: AC
Start: 2023-05-16 — End: ?

## 2023-05-16 MED ORDER — LEVOCETIRIZINE DIHYDROCHLORIDE 5 MG PO TABS: 5.0000 mg | ORAL_TABLET | Freq: Every evening | ORAL | 5 refills | Status: AC

## 2023-05-16 MED ORDER — TACROLIMUS 0.1 % EX OINT: TOPICAL_OINTMENT | Freq: Two times a day (BID) | CUTANEOUS | 5 refills | Status: AC

## 2023-05-16 NOTE — Patient Instructions (Addendum)
 Allergic Rhinitis:  - Positive skin test 04/2023: grasses, cats, feathers  - Use nasal saline rinses before nose sprays such as with Neilmed Sinus Rinse.  Use distilled water.   - Use Flonase 2 sprays each nostril daily. Aim upward and outward. - Use Xyzal 5mg  daily.  This replaces Claritin.   - Okay to stop montelukast.    Dermatitis:  - Take pictures of any further rashes.  - Do a daily soaking tub bath in warm water for 10-15 minutes.  - Use a gentle, unscented cleanser at the end of the bath (such as Dove unscented bar or baby wash, or Aveeno sensitive body wash). Then rinse, pat half-way dry, and apply a gentle, unscented moisturizer cream or ointment (Cerave, Cetaphil, Eucerin, Aveeno, Aquaphor, Vanicream, Vaseline)  all over while still damp. Dry skin makes the itching and rash worse. The skin should be moisturized with a gentle, unscented moisturizer at least twice daily.  - Use only unscented liquid laundry detergent. - Apply prescribed topical steroid (triamcinolone 0.1% below neck) to flared areas after the moisturizer has soaked into the skin (wait at least 30 minutes). Taper off the topical steroids as the skin improves. Do not use topical steroid for more than 7-10 days at a time.  - Put Protopic onto areas of rough rash twice a day. May decrease to once a day as the eczema improves. This will not thin the skin, and is safe for chronic use. Do not put this onto normal appearing skin. - Set up for patch testing - TRUE test 1 week after surgery.    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  - for SKIN only reaction, okay to take Benadryl 2  teaspoonful every 6 hours as needed.   Mild Intermittent Asthma: - Rescue inhaler: Albuterol 2 puffs every 4-6 hours as needed for respiratory symptoms of shortness of breath, or wheezing Asthma control goals:  Full participation in all desired activities (may need albuterol before activity) Albuterol use two times or less a week on  average (not counting use with activity) Cough interfering with sleep two times or less a month Oral steroids no more than once a year No hospitalizations    Follow up: patch testing- TRUE test (Monday, Wednesday, Friday)

## 2023-05-16 NOTE — Progress Notes (Signed)
 FOLLOW UP Date of Service/Encounter:  05/16/23   Subjective:  Brandy Leon (DOB: [birth year]) is a 78 y.o. female [facility] for follow up for skin testing.   History obtained from: chart review and patient.  Anti histamines held.   Past Medical History: Past Medical History:  Diagnosis Date   Allergy    Anxiety    Arthritis    Asthma    Depression    Diabetes mellitus without complication (HCC)     Objective:  There were no vitals taken for this visit. There is no height or weight on file to calculate BMI. Physical Exam: GEN: alert, well developed HEENT: clear conjunctiva, MMM LUNGS: unlabored respiration   Skin Testing:  Skin prick testing was placed, which includes aeroallergens/foods, histamine control, and saline control.  Verbal consent was obtained prior to placing test.  Patient tolerated procedure well.  Allergy testing results were read and interpreted by myself, documented by clinical staff. Adequate positive and negative control.  Positive results to:  Results discussed with patient/family.  Airborne Adult Perc - 05/16/23 1300     Time Antigen Placed 1334    Allergen Manufacturer Waynette Buttery    Location Back    Number of Test 55    Panel 1 Select    1. Control-Buffer 50% Glycerol Negative    2. Control-Histamine 3+    3. Bahia Negative    4. French Southern Territories Negative    5. Johnson Negative    6. Kentucky Blue Negative    7. Meadow Fescue Negative    8. Perennial Rye 3+    9. Timothy 3+    10. Ragweed Mix Negative    11. Cocklebur Negative    12. Plantain,  English Negative    13. Baccharis Negative    14. Dog Fennel Negative    15. Russian Thistle Negative    16. Lamb's Quarters Negative    17. Sheep Sorrell Negative    18. Rough Pigweed Negative    19. Marsh Elder, Rough Negative    20. Mugwort, Common Negative    21. Box, Elder Negative    22. Cedar, red Negative    23. Sweet Gum Negative    24. Pecan  Pollen Negative    25. Pine Mix Negative    26. Walnut, Black Pollen Negative    27. Red Mulberry Negative    28. Ash Mix Negative    29. Birch Mix Negative    30. Beech American Negative    31. Cottonwood, Guinea-Bissau Negative    32. Hickory, White Negative    33. Maple Mix Negative    34. Oak, Guinea-[place]issau Mix Negative    35. Sycamore Eastern Negative    36. Alternaria Alternata Negative    37. Cladosporium Herbarum Negative    38. Aspergillus Mix Negative    39. Penicillium Mix Negative    40. Bipolaris Sorokiniana (Helminthosporium) Negative    41. Drechslera Spicifera (Curvularia) Negative    42. Mucor Plumbeus Negative    43. Fusarium Moniliforme Negative    44. Aureobasidium Pullulans (pullulara) Negative    45. Rhizopus Oryzae Negative    46. Botrytis Cinera Negative    47. Epicoccum Nigrum Negative    48. Phoma Betae Negative    49. Dust Mite Mix Negative    50. Cat Hair 10,000 BAU/ml 3+    51.  Dog Epithelia Negative    52. Mixed Feathers 2+    53. Horse  Epithelia Negative    54. Cockroach, German Negative    55. Tobacco Leaf Negative             13 Food Perc - 05/16/23 1300       Test Information   Allergen Manufacturer Greer    Location Back    Number of allergen test 13      Food   1. Peanut Negative    2. Soybean Negative    3. Wheat Negative    4. Sesame Negative    5. Milk, Cow Negative    6. Casein Negative    7. Egg White, Chicken Negative    8. Shellfish Mix Negative    9. Fish Mix Negative    10. Cashew Negative    11. Walnut Food Negative    12. Almond Negative    13. Hazelnut Negative              Assessment:   1. Seasonal allergic rhinitis due to pollen   2. Dermatitis due to ingested food   3. Allergic rhinitis due to animal hair or dander     Plan/Recommendations:  Allergic Rhinitis: - Due to turbinate hypertrophy, recurrent rashes, asthma and unresponsive to over the counter meds, will perform skin testing to identify  aeroallergen triggers.   - Positive skin test 04/2023: grasses, cats, feathers  - Avoidance measures discussed. - Use nasal saline rinses before nose sprays such as with Neilmed Sinus Rinse.  Use distilled water.   - Use Flonase 2 sprays each nostril daily. Aim upward and outward. - Use Xyzal 5mg  daily.  This replaces Claritin.   - Okay to stop montelukast.   - Consider allergy shots as long term control of your symptoms by teaching your immune system to be more tolerant of your allergy triggers   Dermatitis:  - Unclear etiology; will keep contact dermatitis in differential.  - Take pictures of any further rashes.  - Do a daily soaking tub bath in warm water for 10-15 minutes.  - Use a gentle, unscented cleanser at the end of the bath (such as Dove unscented bar or baby wash, or Aveeno sensitive body wash). Then rinse, pat half-way dry, and apply a gentle, unscented moisturizer cream or ointment (Cerave, Cetaphil, Eucerin, Aveeno, Aquaphor, Vanicream, Vaseline)  all over while still damp. Dry skin makes the itching and rash worse. The skin should be moisturized with a gentle, unscented moisturizer at least twice daily.  - Use only unscented liquid laundry detergent. - Apply prescribed topical steroid (triamcinolone 0.1% below neck) to flared areas after the moisturizer has soaked into the skin (wait at least 30 minutes). Taper off the topical steroids as the skin improves. Do not use topical steroid for more than 7-10 days at a time.  - Put Protopic onto areas of rough rash twice a day. May decrease to once a day as the eczema improves. This will not thin the skin, and is safe for chronic use. Do not put this onto normal appearing skin. - Set up for patch testing 1 week after surgery.  If unremarkable, will need referral to Dermatology.    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  - Initial rxn: rashes and swelling  - 04/2023: sIgE alpha gal 3.51 - for SKIN only reaction, okay to take  Benadryl 2  teaspoonful every 6 hours as needed.   Mild Intermittent Asthma: - Rescue inhaler: Albuterol 2 puffs every 4-6 hours as needed for respiratory symptoms of  shortness of breath, or wheezing Asthma control goals:  Full participation in all desired activities (may need albuterol before activity) Albuterol use two times or less a week on average (not counting use with activity) Cough interfering with sleep two times or less a month Oral steroids no more than once a year No hospitalizations         No follow-ups on file.  Alesia Morin, MD [facility]

## 2023-06-13 ENCOUNTER — Ambulatory Visit: Admitting: Internal Medicine

## 2023-06-15 ENCOUNTER — Encounter: Admitting: Allergy & Immunology

## 2023-06-17 ENCOUNTER — Encounter: Admitting: Allergy & Immunology

## 2024-01-18 ENCOUNTER — Other Ambulatory Visit: Payer: Self-pay | Admitting: Adult Health Nurse Practitioner

## 2024-01-18 DIAGNOSIS — R011 Cardiac murmur, unspecified: Secondary | ICD-10-CM

## 2024-01-27 ENCOUNTER — Telehealth: Payer: Self-pay

## 2024-01-27 NOTE — Telephone Encounter (Signed)
 Received referral to schedule an Echo. Called patient to schedule Echol- no answer - mailbox is full

## 2024-02-06 NOTE — Telephone Encounter (Signed)
 02/06/24 called - no answer needs to have Echo scheduled.
